# Patient Record
Sex: Male | Born: 1935 | Race: White | Hispanic: No | Marital: Married | Smoking: Never smoker
Health system: Southern US, Community
[De-identification: ages and names within clinical notes are randomized; demographics above are authoritative.]

## PROBLEM LIST (undated history)

## (undated) DIAGNOSIS — R001 Bradycardia, unspecified: Secondary | ICD-10-CM

## (undated) DIAGNOSIS — Z5189 Encounter for other specified aftercare: Secondary | ICD-10-CM

## (undated) DIAGNOSIS — R9389 Abnormal findings on diagnostic imaging of other specified body structures: Secondary | ICD-10-CM

## (undated) DIAGNOSIS — I4891 Unspecified atrial fibrillation: Secondary | ICD-10-CM

## (undated) DIAGNOSIS — I350 Nonrheumatic aortic (valve) stenosis: Secondary | ICD-10-CM

## (undated) DIAGNOSIS — I712 Thoracic aortic aneurysm, without rupture, unspecified: Secondary | ICD-10-CM

## (undated) DIAGNOSIS — I351 Nonrheumatic aortic (valve) insufficiency: Secondary | ICD-10-CM

## (undated) DIAGNOSIS — I10 Essential (primary) hypertension: Secondary | ICD-10-CM

## (undated) DIAGNOSIS — I5022 Chronic systolic (congestive) heart failure: Secondary | ICD-10-CM

## (undated) DIAGNOSIS — I509 Heart failure, unspecified: Secondary | ICD-10-CM

## (undated) DIAGNOSIS — Z7901 Long term (current) use of anticoagulants: Secondary | ICD-10-CM

## (undated) DIAGNOSIS — M1990 Unspecified osteoarthritis, unspecified site: Secondary | ICD-10-CM

## (undated) DIAGNOSIS — D62 Acute posthemorrhagic anemia: Secondary | ICD-10-CM

## (undated) DIAGNOSIS — Z9289 Personal history of other medical treatment: Secondary | ICD-10-CM

## (undated) DIAGNOSIS — I5021 Acute systolic (congestive) heart failure: Secondary | ICD-10-CM

## (undated) DIAGNOSIS — C801 Malignant (primary) neoplasm, unspecified: Secondary | ICD-10-CM

## (undated) DIAGNOSIS — I499 Cardiac arrhythmia, unspecified: Secondary | ICD-10-CM

## (undated) DIAGNOSIS — I2510 Atherosclerotic heart disease of native coronary artery without angina pectoris: Secondary | ICD-10-CM

## (undated) HISTORY — DX: Abnormal findings on diagnostic imaging of other specified body structures: R93.89

## (undated) HISTORY — DX: Thoracic aortic aneurysm, without rupture: I71.2

## (undated) HISTORY — DX: Atherosclerotic heart disease of native coronary artery without angina pectoris: I25.10

## (undated) HISTORY — DX: Acute systolic (congestive) heart failure: I50.21

## (undated) HISTORY — DX: Personal history of other medical treatment: Z92.89

## (undated) HISTORY — DX: Thoracic aortic aneurysm, without rupture, unspecified: I71.20

## (undated) HISTORY — DX: Nonrheumatic aortic (valve) insufficiency: I35.1

## (undated) HISTORY — DX: Acute posthemorrhagic anemia: D62

## (undated) HISTORY — DX: Long term (current) use of anticoagulants: Z79.01

---

## 1998-04-15 ENCOUNTER — Other Ambulatory Visit: Admission: RE | Admit: 1998-04-15 | Discharge: 1998-04-15 | Payer: Self-pay | Admitting: Hematology & Oncology

## 1999-02-17 ENCOUNTER — Encounter: Payer: Self-pay | Admitting: Interventional Cardiology

## 1999-02-17 ENCOUNTER — Inpatient Hospital Stay (HOSPITAL_COMMUNITY): Admission: EM | Admit: 1999-02-17 | Discharge: 1999-02-21 | Payer: Self-pay | Admitting: *Deleted

## 1999-02-20 HISTORY — PX: CARDIAC CATHETERIZATION: SHX172

## 1999-04-20 ENCOUNTER — Inpatient Hospital Stay (HOSPITAL_COMMUNITY): Admission: EM | Admit: 1999-04-20 | Discharge: 1999-04-24 | Payer: Self-pay | Admitting: Emergency Medicine

## 1999-04-20 ENCOUNTER — Encounter: Payer: Self-pay | Admitting: Cardiovascular Disease

## 2002-03-31 ENCOUNTER — Ambulatory Visit (HOSPITAL_COMMUNITY): Admission: RE | Admit: 2002-03-31 | Discharge: 2002-03-31 | Payer: Self-pay | Admitting: Orthopedic Surgery

## 2002-03-31 ENCOUNTER — Encounter: Payer: Self-pay | Admitting: Orthopedic Surgery

## 2002-06-15 ENCOUNTER — Ambulatory Visit: Admission: RE | Admit: 2002-06-15 | Discharge: 2002-09-05 | Payer: Self-pay | Admitting: Radiation Oncology

## 2002-10-13 ENCOUNTER — Ambulatory Visit: Admission: RE | Admit: 2002-10-13 | Discharge: 2002-10-13 | Payer: Self-pay | Admitting: Radiation Oncology

## 2002-11-16 ENCOUNTER — Encounter: Payer: Self-pay | Admitting: Hematology & Oncology

## 2002-11-16 ENCOUNTER — Ambulatory Visit (HOSPITAL_COMMUNITY): Admission: RE | Admit: 2002-11-16 | Discharge: 2002-11-16 | Payer: Self-pay | Admitting: Hematology & Oncology

## 2005-01-03 ENCOUNTER — Ambulatory Visit: Payer: Self-pay | Admitting: Hematology & Oncology

## 2005-07-04 ENCOUNTER — Ambulatory Visit: Payer: Self-pay | Admitting: Hematology & Oncology

## 2006-01-02 ENCOUNTER — Ambulatory Visit: Payer: Self-pay | Admitting: Hematology & Oncology

## 2006-07-02 ENCOUNTER — Ambulatory Visit: Payer: Self-pay | Admitting: Hematology & Oncology

## 2006-07-04 LAB — CBC WITH DIFFERENTIAL/PLATELET
BASO%: 0.3 % (ref 0.0–2.0)
Eosinophils Absolute: 0 10*3/uL (ref 0.0–0.5)
LYMPH%: 21.6 % (ref 14.0–48.0)
MCHC: 34.5 g/dL (ref 32.0–35.9)
MONO#: 0.4 10*3/uL (ref 0.1–0.9)
NEUT#: 3.4 10*3/uL (ref 1.5–6.5)
Platelets: 220 10*3/uL (ref 145–400)
RBC: 4.63 10*6/uL (ref 4.20–5.71)
RDW: 13 % (ref 11.2–14.6)
WBC: 5 10*3/uL (ref 4.0–10.0)
lymph#: 1.1 10*3/uL (ref 0.9–3.3)

## 2006-07-04 LAB — PSA: PSA: 0.01 ng/mL — ABNORMAL LOW (ref 0.10–4.00)

## 2006-07-04 LAB — CHCC SMEAR

## 2006-12-31 ENCOUNTER — Ambulatory Visit: Payer: Self-pay | Admitting: Hematology & Oncology

## 2007-01-02 LAB — COMPREHENSIVE METABOLIC PANEL
ALT: 13 U/L (ref 0–53)
Albumin: 4.1 g/dL (ref 3.5–5.2)
Alkaline Phosphatase: 71 U/L (ref 39–117)
CO2: 26 mEq/L (ref 19–32)
Glucose, Bld: 165 mg/dL — ABNORMAL HIGH (ref 70–99)
Potassium: 4.9 mEq/L (ref 3.5–5.3)
Sodium: 138 mEq/L (ref 135–145)
Total Bilirubin: 0.8 mg/dL (ref 0.3–1.2)
Total Protein: 6.9 g/dL (ref 6.0–8.3)

## 2007-01-02 LAB — CBC WITH DIFFERENTIAL/PLATELET
Basophils Absolute: 0 10*3/uL (ref 0.0–0.1)
Eosinophils Absolute: 0 10*3/uL (ref 0.0–0.5)
HGB: 14.9 g/dL (ref 13.0–17.1)
MONO#: 0.4 10*3/uL (ref 0.1–0.9)
MONO%: 8.9 % (ref 0.0–13.0)
NEUT#: 3.6 10*3/uL (ref 1.5–6.5)
RBC: 4.63 10*6/uL (ref 4.20–5.71)
RDW: 13.7 % (ref 11.2–14.6)
WBC: 4.6 10*3/uL (ref 4.0–10.0)
lymph#: 0.6 10*3/uL — ABNORMAL LOW (ref 0.9–3.3)

## 2007-01-02 LAB — CHCC SMEAR

## 2007-01-02 LAB — PSA: PSA: <0.01 ng/mL — ABNORMAL LOW (ref 0.10–4.00)

## 2007-07-01 ENCOUNTER — Ambulatory Visit: Payer: Self-pay | Admitting: Hematology & Oncology

## 2008-12-29 ENCOUNTER — Encounter: Admission: RE | Admit: 2008-12-29 | Discharge: 2008-12-29 | Payer: Self-pay | Admitting: Cardiovascular Disease

## 2009-05-26 ENCOUNTER — Inpatient Hospital Stay (HOSPITAL_COMMUNITY): Admission: EM | Admit: 2009-05-26 | Discharge: 2009-05-30 | Payer: Self-pay | Admitting: Emergency Medicine

## 2009-06-06 DIAGNOSIS — Z9289 Personal history of other medical treatment: Secondary | ICD-10-CM

## 2009-06-06 HISTORY — DX: Personal history of other medical treatment: Z92.89

## 2009-09-12 ENCOUNTER — Encounter: Admission: RE | Admit: 2009-09-12 | Discharge: 2009-09-12 | Payer: Self-pay | Admitting: Family Medicine

## 2010-01-30 DIAGNOSIS — R9389 Abnormal findings on diagnostic imaging of other specified body structures: Secondary | ICD-10-CM

## 2010-01-30 HISTORY — DX: Abnormal findings on diagnostic imaging of other specified body structures: R93.89

## 2010-06-02 ENCOUNTER — Encounter: Admission: RE | Admit: 2010-06-02 | Discharge: 2010-06-02 | Payer: Self-pay | Admitting: Family Medicine

## 2010-06-08 ENCOUNTER — Encounter: Admission: RE | Admit: 2010-06-08 | Discharge: 2010-06-08 | Payer: Self-pay | Admitting: Family Medicine

## 2010-07-07 ENCOUNTER — Ambulatory Visit (HOSPITAL_COMMUNITY): Admission: RE | Admit: 2010-07-07 | Discharge: 2010-07-07 | Payer: Self-pay | Admitting: Neurosurgery

## 2010-10-29 ENCOUNTER — Encounter: Payer: Self-pay | Admitting: Family Medicine

## 2010-11-05 IMAGING — CT CT ANGIO PELVIS
2 of 6 series · 15 of 46 positions shown, 17 images · IV contrast (APPLIED)
Comparison: CT chest of 12/29/2008

CTA CHEST

CLINICAL DATA: Chest pain, shortness of breath, possible
dissection, hypertension

CT ANGIOGRAPHY CHEST, ABDOMEN AND PELVIS
TECHNIQUE: Multidetector CT imaging through the chest, abdomen and
pelvis was performed using the standard protocol during bolus
administration of intravenous contrast. Multiplanar reconstructed
images including MIPs were obtained and reviewed to evaluate the
vascular anatomy.
Contrast: 120 ml [id number]-ZNN D

[Series 6: dissection 2.0 st · axial · 0.77mm/px · z∈[+752,+1334]mm · 12 of 332 slices shown, 14 images]
[im 27/332  soft-tissue]
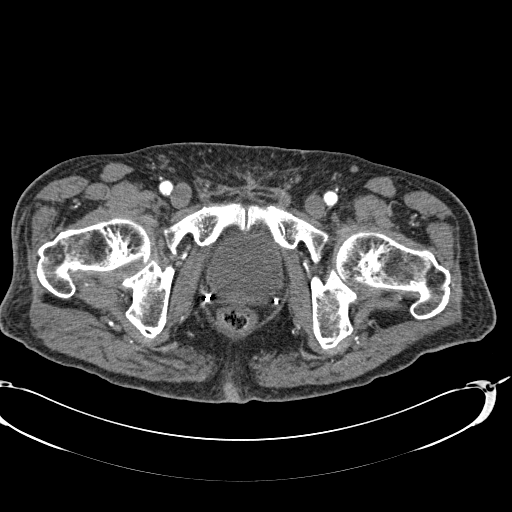
[im 27/332  bone]
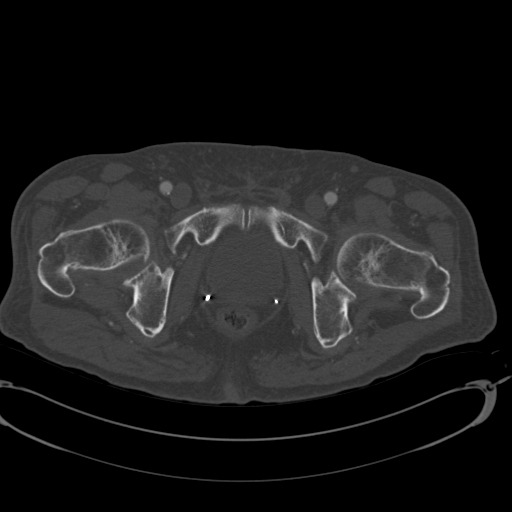
[im 53/332  soft-tissue]
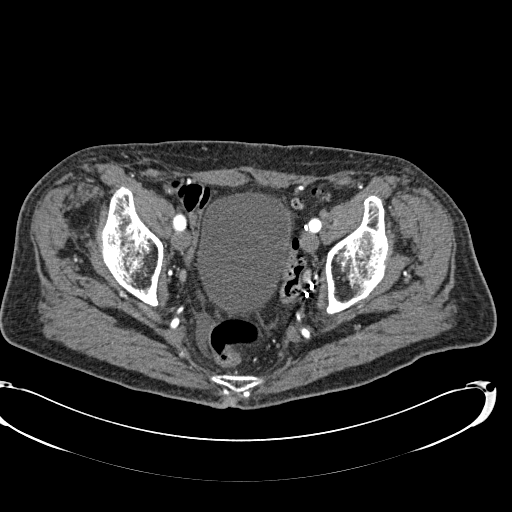
[im 80/332  soft-tissue]
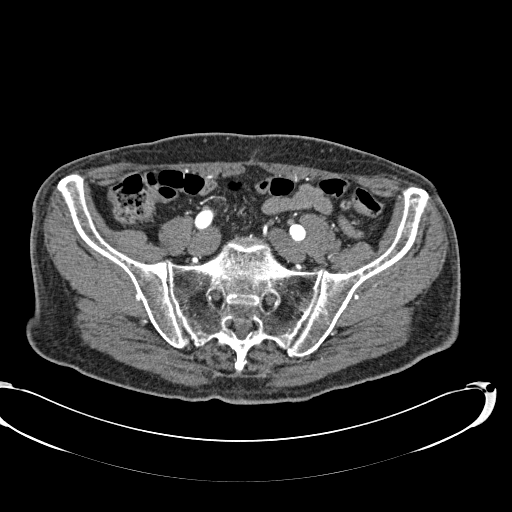
[im 106/332  soft-tissue]
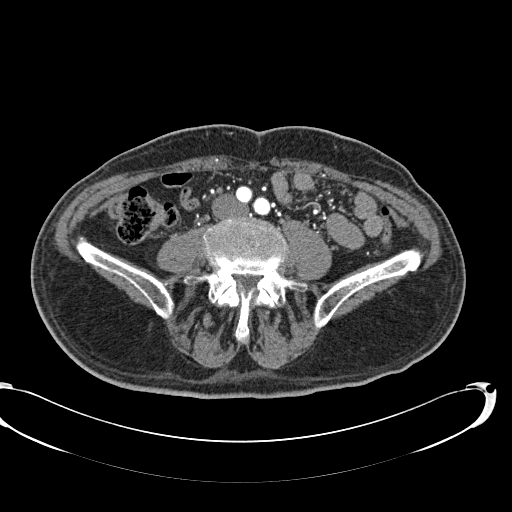
[im 133/332  soft-tissue]
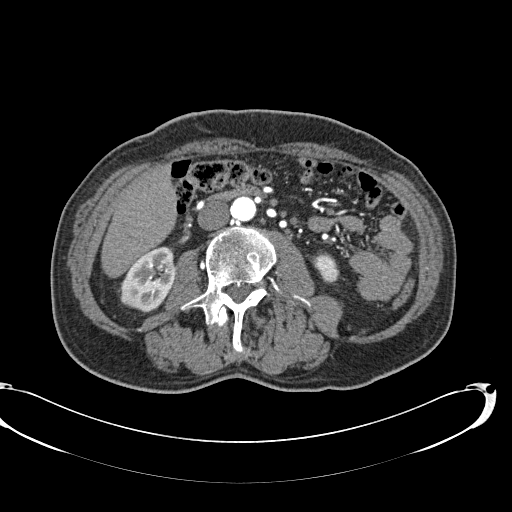
[im 159/332  soft-tissue]
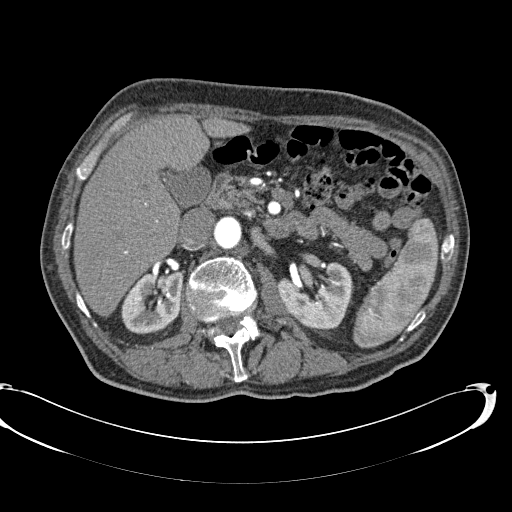
[im 186/332  soft-tissue]
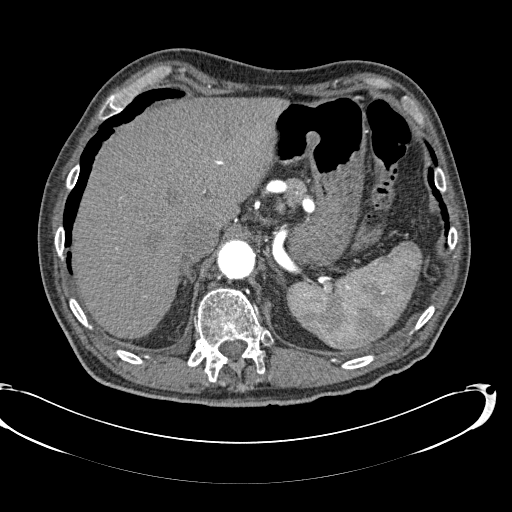
[im 212/332  soft-tissue]
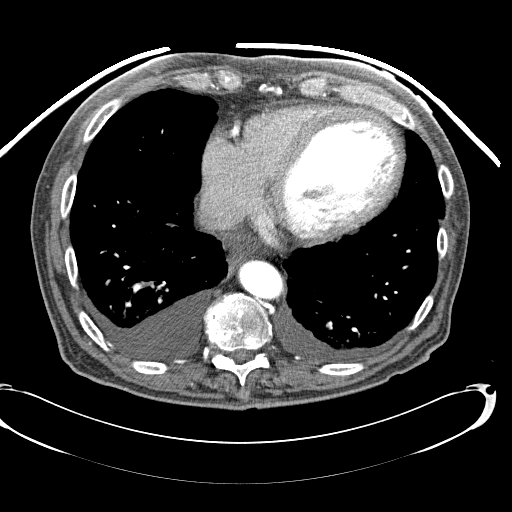
[im 239/332  soft-tissue]
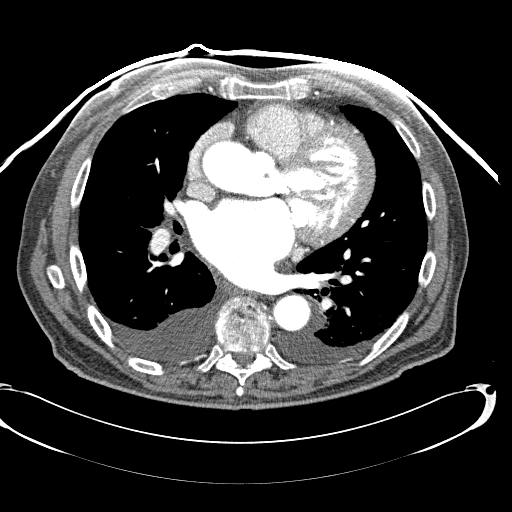
[im 239/332  bone]
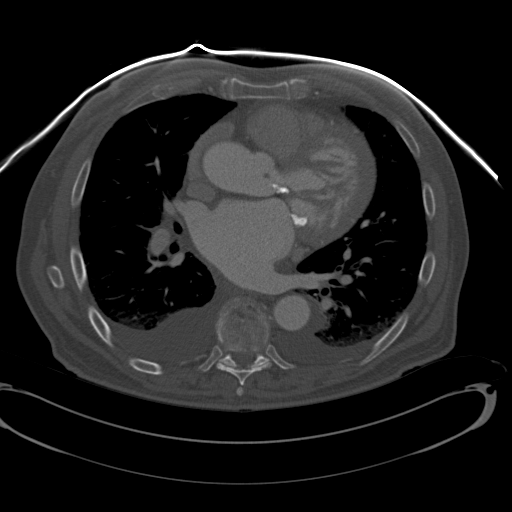
[im 265/332  soft-tissue]
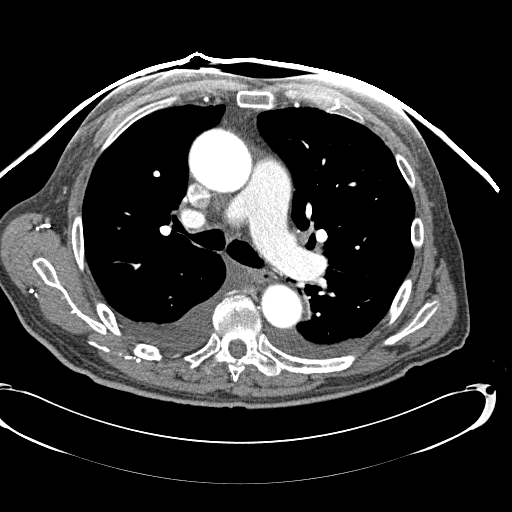
[im 292/332  soft-tissue]
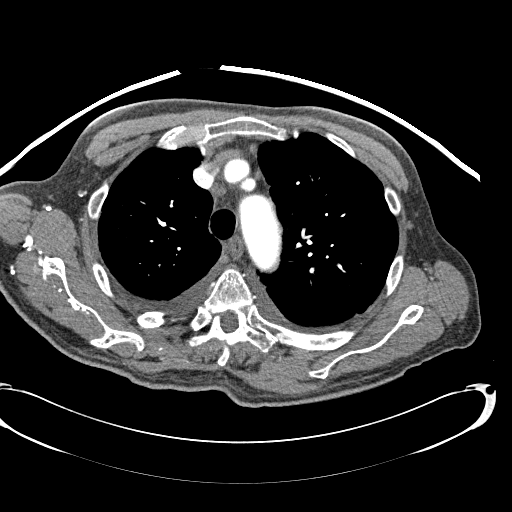
[im 318/332  soft-tissue]
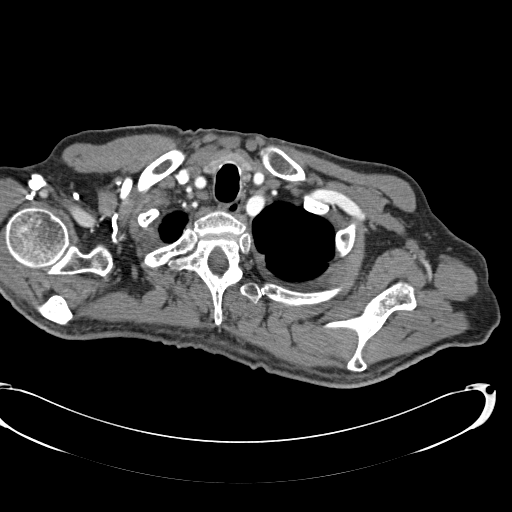

[Series 602: coronals · coronal · 1.30mm/px · 3 of 118 slices shown]
[im 30/118  soft-tissue]
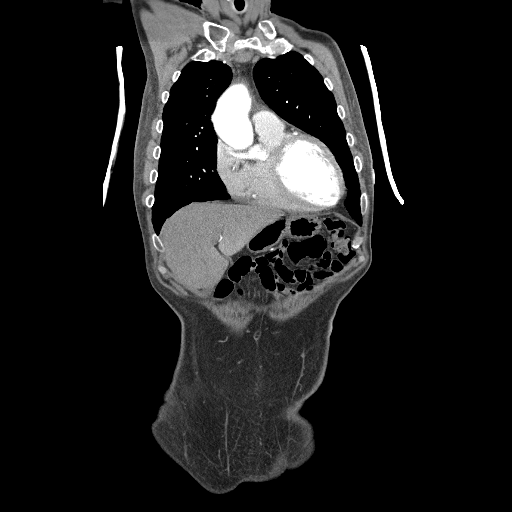
[im 59/118  soft-tissue]
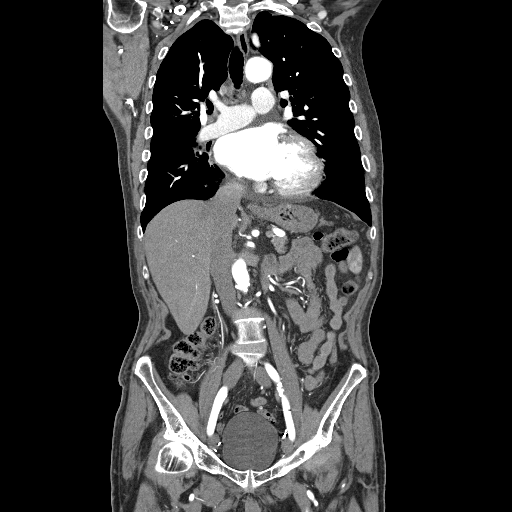
[im 88/118  soft-tissue]
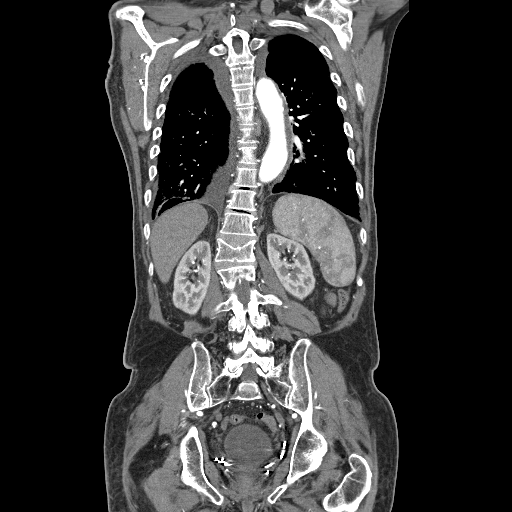

[15 of 46 positions shown; findings below may reference images not displayed]

FINDINGS: The thoracic aorta opacifies and there is no evidence of
acute dissection.  As noted on the prior dictation there is
fusiform dilatation of the ascending aorta, which is stable
measuring 50 x 50 mm in maximum diameter.  The pulmonary arteries
opacify with no acute pulmonary embolism noted.  No mediastinal or
hilar adenopathy is seen, with small mediastinal nodes appearing
stable.  The origins of the great vessels appear patent.

On the lung window images there are bilateral pleural effusions
present.  There is bibasilar atelectasis present and mild
interstitial edema is a consideration.  No lung nodule or
infiltrate is seen.

 Review of the MIP images confirms the above findings.
IMPRESSION: 1.  No evidence of acute aortic dissection.
2.  No pulmonary embolism is seen.
3.  Small pleural effusions with basilar atelectasis.  Cannot
exclude mild interstitial edema.

CTA ABDOMEN
FINDINGS: A focus of enhancement in the dome of the right lobe of
liver most like represents a small hypervascular hemangioma.
Otherwise the liver enhances with no focal abnormality.  No
calcified gallstones are seen.  The pancreas is normal in size and
the pancreatic duct is not dilated.  The adrenal glands and spleen
appear normal.  The kidneys enhance with no evidence of mass or
hydronephrosis.  Atheromatous changes noted in the abdominal aorta
but no focal aneurysm is seen and no dissection is noted.  There
appears to be a separate origin of the right hepatic to the right
of  the celiac axis.  The SMA is widely patent.  The renal arteries
are patent, as is the IMA.  There appear to be three right renal
arteries and two left renal arteries present.  No adenopathy is
seen.

 Review of the MIP images confirms the above findings.
IMPRESSION: 1.  No evidence of abdominal aortic aneurysm.
2.   Probable small hemangioma in the dome of the right lobe of
liver.
3.  Three right renal arteries and two left renal arteries are
noted.

CTA PELVIS
FINDINGS: The common iliac arteries are patent with no evidence of
stenosis.  Mild atheromatous change is noted in the distal left
common iliac artery.  The external iliac arteries and internal
iliac arteries are patent.  The urinary bladder is unremarkable.
Surgical clips are present apparently due to prior prostatectomy.
There is a tiny amount of fluid in the posterior right pelvis.
Rectosigmoid colonic diverticula are noted.  The terminal ileum
appears normal.  No acute bony abnormality is seen.

 Review of the MIP images confirms the above findings. Probable
small hemangioma in the dome of the right lobe of liver.
IMPRESSION: 1.  No vascular abnormality.
2.  Multiple rectosigmoid colonic diverticula. Small amount of free
fluid in pelvis of questionable significance.

## 2011-01-13 LAB — POCT I-STAT, CHEM 8
BUN: 25 mg/dL — ABNORMAL HIGH (ref 6–23)
Calcium, Ion: 1.08 mmol/L — ABNORMAL LOW (ref 1.12–1.32)
Creatinine, Ser: 1.2 mg/dL (ref 0.4–1.5)
Glucose, Bld: 102 mg/dL — ABNORMAL HIGH (ref 70–99)
Sodium: 139 mEq/L (ref 135–145)
TCO2: 23 mmol/L (ref 0–100)

## 2011-01-13 LAB — APTT: aPTT: 31 seconds (ref 24–37)

## 2011-01-13 LAB — CBC
HCT: 35.8 % — ABNORMAL LOW (ref 39.0–52.0)
HCT: 40.3 % (ref 39.0–52.0)
Hemoglobin: 12.4 g/dL — ABNORMAL LOW (ref 13.0–17.0)
Hemoglobin: 13.3 g/dL (ref 13.0–17.0)
Hemoglobin: 13.6 g/dL (ref 13.0–17.0)
MCHC: 33.7 g/dL (ref 30.0–36.0)
MCV: 96.7 fL (ref 78.0–100.0)
MCV: 97 fL (ref 78.0–100.0)
RBC: 3.7 MIL/uL — ABNORMAL LOW (ref 4.22–5.81)
RBC: 4 MIL/uL — ABNORMAL LOW (ref 4.22–5.81)
RBC: 4.15 MIL/uL — ABNORMAL LOW (ref 4.22–5.81)
RDW: 14.6 % (ref 11.5–15.5)
WBC: 5.3 10*3/uL (ref 4.0–10.5)
WBC: 6 10*3/uL (ref 4.0–10.5)

## 2011-01-13 LAB — CARDIAC PANEL(CRET KIN+CKTOT+MB+TROPI)
CK, MB: 2.2 ng/mL (ref 0.3–4.0)
CK, MB: 6.8 ng/mL — ABNORMAL HIGH (ref 0.3–4.0)
Total CK: 42 U/L (ref 7–232)
Total CK: 91 U/L (ref 7–232)

## 2011-01-13 LAB — BASIC METABOLIC PANEL
CO2: 29 mEq/L (ref 19–32)
CO2: 30 mEq/L (ref 19–32)
Calcium: 8.7 mg/dL (ref 8.4–10.5)
Calcium: 8.8 mg/dL (ref 8.4–10.5)
Calcium: 9 mg/dL (ref 8.4–10.5)
Chloride: 100 mEq/L (ref 96–112)
Chloride: 101 mEq/L (ref 96–112)
Creatinine, Ser: 1.25 mg/dL (ref 0.4–1.5)
GFR calc Af Amer: 53 mL/min — ABNORMAL LOW (ref >60)
GFR calc Af Amer: 59 mL/min — ABNORMAL LOW (ref >60)
GFR calc Af Amer: >60 mL/min (ref >60)
GFR calc non Af Amer: 49 mL/min — ABNORMAL LOW (ref >60)
GFR calc non Af Amer: 56 mL/min — ABNORMAL LOW (ref >60)
Glucose, Bld: 85 mg/dL (ref 70–99)
Potassium: 4.1 mEq/L (ref 3.5–5.1)
Sodium: 138 mEq/L (ref 135–145)
Sodium: 139 mEq/L (ref 135–145)
Sodium: 141 mEq/L (ref 135–145)

## 2011-01-13 LAB — HEPATIC FUNCTION PANEL
ALT: 21 U/L (ref 0–53)
AST: 25 U/L (ref 0–37)
Albumin: 4 g/dL (ref 3.5–5.2)
Total Bilirubin: 1.5 mg/dL — ABNORMAL HIGH (ref 0.3–1.2)

## 2011-01-13 LAB — URINALYSIS, ROUTINE W REFLEX MICROSCOPIC
Glucose, UA: NEGATIVE mg/dL
Hgb urine dipstick: NEGATIVE
Ketones, ur: 15 mg/dL — AB
Protein, ur: 30 mg/dL — AB

## 2011-01-13 LAB — URINE MICROSCOPIC-ADD ON

## 2011-01-13 LAB — DIFFERENTIAL
Eosinophils Absolute: 0 10*3/uL (ref 0.0–0.7)
Eosinophils Relative: 0 % (ref 0–5)
Lymphs Abs: 1.1 10*3/uL (ref 0.7–4.0)
Monocytes Absolute: 0.6 10*3/uL (ref 0.1–1.0)
Monocytes Relative: 10 % (ref 3–12)

## 2011-01-13 LAB — BRAIN NATRIURETIC PEPTIDE
Pro B Natriuretic peptide (BNP): 455 pg/mL — ABNORMAL HIGH (ref 0.0–100.0)
Pro B Natriuretic peptide (BNP): 668 pg/mL — ABNORMAL HIGH (ref 0.0–100.0)
Pro B Natriuretic peptide (BNP): 708 pg/mL — ABNORMAL HIGH (ref 0.0–100.0)

## 2011-01-13 LAB — HEMOGLOBIN A1C: Mean Plasma Glucose: 111 mg/dL

## 2011-01-13 LAB — LIPID PANEL
Cholesterol: 131 mg/dL (ref 0–200)
LDL Cholesterol: 97 mg/dL (ref 0–99)
Triglycerides: 36 mg/dL (ref <150)

## 2011-01-13 LAB — POCT CARDIAC MARKERS
CKMB, poc: 1.1 ng/mL (ref 1.0–8.0)
Myoglobin, poc: 93 ng/mL (ref 12–200)

## 2011-01-13 LAB — MAGNESIUM: Magnesium: 2.3 mg/dL (ref 1.5–2.5)

## 2011-01-13 LAB — PROTIME-INR: INR: 1.1 (ref 0.00–1.49)

## 2011-01-13 LAB — HEPARIN LEVEL (UNFRACTIONATED): Heparin Unfractionated: 1.05 IU/mL — ABNORMAL HIGH (ref 0.30–0.70)

## 2011-01-13 LAB — CK TOTAL AND CKMB (NOT AT ARMC): Total CK: 62 U/L (ref 7–232)

## 2012-03-04 ENCOUNTER — Other Ambulatory Visit (HOSPITAL_COMMUNITY): Payer: Self-pay | Admitting: Orthopaedic Surgery

## 2012-03-05 ENCOUNTER — Encounter (HOSPITAL_COMMUNITY): Payer: Self-pay

## 2012-03-05 ENCOUNTER — Encounter (HOSPITAL_COMMUNITY)
Admission: RE | Admit: 2012-03-05 | Discharge: 2012-03-05 | Disposition: A | Discharge status: Home / Self Care | Payer: Medicare Other | Source: Ambulatory Visit | Attending: Orthopaedic Surgery | Admitting: Orthopaedic Surgery

## 2012-03-05 ENCOUNTER — Encounter (HOSPITAL_COMMUNITY): Payer: Self-pay | Admitting: Pharmacy Technician

## 2012-03-05 DIAGNOSIS — I499 Cardiac arrhythmia, unspecified: Secondary | ICD-10-CM

## 2012-03-05 DIAGNOSIS — M1990 Unspecified osteoarthritis, unspecified site: Secondary | ICD-10-CM

## 2012-03-05 DIAGNOSIS — C801 Malignant (primary) neoplasm, unspecified: Secondary | ICD-10-CM

## 2012-03-05 DIAGNOSIS — Z5189 Encounter for other specified aftercare: Secondary | ICD-10-CM

## 2012-03-05 HISTORY — DX: Encounter for other specified aftercare: Z51.89

## 2012-03-05 HISTORY — DX: Essential (primary) hypertension: I10

## 2012-03-05 HISTORY — PX: PROSTATE SURGERY: SHX751

## 2012-03-05 HISTORY — PX: APPENDECTOMY: SHX54

## 2012-03-05 HISTORY — PX: HERNIA REPAIR: SHX51

## 2012-03-05 HISTORY — DX: Unspecified osteoarthritis, unspecified site: M19.90

## 2012-03-05 HISTORY — DX: Malignant (primary) neoplasm, unspecified: C80.1

## 2012-03-05 HISTORY — DX: Cardiac arrhythmia, unspecified: I49.9

## 2012-03-05 HISTORY — PX: KNEE ARTHROSCOPY: SUR90

## 2012-03-05 LAB — BASIC METABOLIC PANEL
BUN: 26 mg/dL — ABNORMAL HIGH (ref 6–23)
CO2: 29 mEq/L (ref 19–32)
Chloride: 100 mEq/L (ref 96–112)
GFR calc non Af Amer: 50 mL/min — ABNORMAL LOW (ref >90)
Glucose, Bld: 97 mg/dL (ref 70–99)
Potassium: 4.7 mEq/L (ref 3.5–5.1)

## 2012-03-05 LAB — URINALYSIS, ROUTINE W REFLEX MICROSCOPIC
Bilirubin Urine: NEGATIVE
Glucose, UA: NEGATIVE mg/dL
Hgb urine dipstick: NEGATIVE
Ketones, ur: NEGATIVE mg/dL
Protein, ur: NEGATIVE mg/dL
pH: 5 (ref 5.0–8.0)

## 2012-03-05 LAB — CBC
HCT: 35.9 % — ABNORMAL LOW (ref 39.0–52.0)
Hemoglobin: 11.6 g/dL — ABNORMAL LOW (ref 13.0–17.0)
MCH: 30.2 pg (ref 26.0–34.0)
MCHC: 32.3 g/dL (ref 30.0–36.0)
MCV: 93.5 fL (ref 78.0–100.0)
RBC: 3.84 MIL/uL — ABNORMAL LOW (ref 4.22–5.81)

## 2012-03-05 NOTE — Pre-Procedure Instructions (Addendum)
03-05-12 Teach/ back method used periop instructions. WUJ(81'19), Echo report(4'10), Stress test (06-06-09),CXR(4-30 -13) reports with chart.  03-06-12 Pt will use Mupirocin Oint as directed.

## 2012-03-05 NOTE — Patient Instructions (Addendum)
[address]  03/05/2012   Your procedure is scheduled on: 5-31  -2013  Report to [facility] at   0745     AM.  Call this number if you have problems the morning of surgery: [phone]   Remember:   Do not eat food:After Midnight.    Take these medicines the morning of surgery with A SIP OF WATER: Amiodarone, Protonix, Tramadol   Do not wear jewelry, make-up or nail polish.  Do not wear lotions, powders, or perfumes. You may wear deodorant.  Do not shave 48 hours prior to surgery.(face and neck okay, no shaving of legs)  Do not bring valuables to the hospital.  Contacts, dentures or bridgework may not be worn into surgery.  Leave suitcase in the car. After surgery it may be brought to your room.  For patients admitted to the hospital, checkout time is 11:00 AM the day of discharge.   Patients discharged the day of surgery will not be allowed to drive home.  Name and phone number of your driver: daughter/son  Special Instructions: CHG Shower Use Special Wash: 1/2 bottle night before surgery and 1/2 bottle morning of surgery.(avoid face and genitals)   Please read over the following fact sheets that you were given: MRSA Information, Blood Transfusion fact sheet.

## 2012-03-07 ENCOUNTER — Ambulatory Visit (HOSPITAL_COMMUNITY): Payer: Medicare Other

## 2012-03-07 ENCOUNTER — Encounter (HOSPITAL_COMMUNITY)
Admission: RE | Disposition: A | Discharge status: Home Health | Payer: Self-pay | Source: Ambulatory Visit | Attending: Orthopaedic Surgery

## 2012-03-07 ENCOUNTER — Encounter (HOSPITAL_COMMUNITY): Payer: Self-pay | Admitting: Anesthesiology

## 2012-03-07 ENCOUNTER — Inpatient Hospital Stay (HOSPITAL_COMMUNITY)
Admission: RE | Admit: 2012-03-07 | Discharge: 2012-03-11 | DRG: 470 | Disposition: A | Discharge status: Home Health | Payer: Medicare Other | Source: Ambulatory Visit | Attending: Orthopaedic Surgery | Admitting: Orthopaedic Surgery

## 2012-03-07 ENCOUNTER — Ambulatory Visit (HOSPITAL_COMMUNITY): Payer: Medicare Other | Admitting: Anesthesiology

## 2012-03-07 ENCOUNTER — Encounter (HOSPITAL_COMMUNITY): Payer: Self-pay | Admitting: *Deleted

## 2012-03-07 DIAGNOSIS — M1610 Unilateral primary osteoarthritis, unspecified hip: Principal | ICD-10-CM | POA: Diagnosis present

## 2012-03-07 DIAGNOSIS — Z01812 Encounter for preprocedural laboratory examination: Secondary | ICD-10-CM

## 2012-03-07 DIAGNOSIS — D62 Acute posthemorrhagic anemia: Secondary | ICD-10-CM | POA: Diagnosis not present

## 2012-03-07 DIAGNOSIS — M169 Osteoarthritis of hip, unspecified: Principal | ICD-10-CM | POA: Diagnosis present

## 2012-03-07 DIAGNOSIS — I10 Essential (primary) hypertension: Secondary | ICD-10-CM | POA: Diagnosis present

## 2012-03-07 HISTORY — PX: TOTAL HIP ARTHROPLASTY: SHX124

## 2012-03-07 SURGERY — ARTHROPLASTY, HIP, TOTAL, ANTERIOR APPROACH
Anesthesia: Spinal | Site: Hip | Laterality: Right | Wound class: Clean

## 2012-03-07 MED ORDER — ACETAMINOPHEN 10 MG/ML IV SOLN
INTRAVENOUS | Status: DC | PRN
  Administered 2012-03-07: 1000 mg via INTRAVENOUS

## 2012-03-07 MED ORDER — RIVAROXABAN 10 MG PO TABS
10.0000 mg | ORAL_TABLET | Freq: Every day | ORAL | Status: DC
  Administered 2012-03-08 – 2012-03-11 (×4): 10 mg via ORAL
  Filled 2012-03-07 (×5): qty 1

## 2012-03-07 MED ORDER — TRAMADOL HCL 50 MG PO TABS
50.0000 mg | ORAL_TABLET | Freq: Four times a day (QID) | ORAL | Status: DC
  Administered 2012-03-07 – 2012-03-10 (×10): 50 mg via ORAL
  Filled 2012-03-07 (×19): qty 1

## 2012-03-07 MED ORDER — ACETAMINOPHEN 325 MG PO TABS
650.0000 mg | ORAL_TABLET | Freq: Four times a day (QID) | ORAL | Status: DC | PRN
  Administered 2012-03-10: 650 mg via ORAL
  Filled 2012-03-07 (×2): qty 2

## 2012-03-07 MED ORDER — FUROSEMIDE 40 MG PO TABS
40.0000 mg | ORAL_TABLET | Freq: Every day | ORAL | Status: DC
  Administered 2012-03-08 – 2012-03-10 (×3): 40 mg via ORAL
  Filled 2012-03-07 (×5): qty 1

## 2012-03-07 MED ORDER — MORPHINE SULFATE 2 MG/ML IJ SOLN
1.0000 mg | INTRAMUSCULAR | Status: DC | PRN
  Administered 2012-03-07 – 2012-03-08 (×5): 1 mg via INTRAVENOUS
  Filled 2012-03-07 (×5): qty 1

## 2012-03-07 MED ORDER — LACTATED RINGERS IV SOLN
INTRAVENOUS | Status: DC | PRN
  Administered 2012-03-07 (×2): via INTRAVENOUS

## 2012-03-07 MED ORDER — MENTHOL 3 MG MT LOZG
1.0000 | LOZENGE | OROMUCOSAL | Status: DC | PRN
  Filled 2012-03-07: qty 9

## 2012-03-07 MED ORDER — ACETAMINOPHEN 10 MG/ML IV SOLN
INTRAVENOUS | Status: AC
  Filled 2012-03-07: qty 100

## 2012-03-07 MED ORDER — 0.9 % SODIUM CHLORIDE (POUR BTL) OPTIME
TOPICAL | Status: DC | PRN
  Administered 2012-03-07: 1000 mL

## 2012-03-07 MED ORDER — ONDANSETRON HCL 4 MG PO TABS
4.0000 mg | ORAL_TABLET | Freq: Four times a day (QID) | ORAL | Status: DC | PRN
  Administered 2012-03-10: 4 mg via ORAL
  Filled 2012-03-07 (×2): qty 1

## 2012-03-07 MED ORDER — PANTOPRAZOLE SODIUM 40 MG PO TBEC
40.0000 mg | DELAYED_RELEASE_TABLET | Freq: Every day | ORAL | Status: DC
  Administered 2012-03-08 – 2012-03-10 (×3): 40 mg via ORAL
  Filled 2012-03-07 (×5): qty 1

## 2012-03-07 MED ORDER — DIPHENHYDRAMINE HCL 12.5 MG/5ML PO ELIX
12.5000 mg | ORAL_SOLUTION | ORAL | Status: DC | PRN
  Administered 2012-03-10: 12.5 mg via ORAL
  Filled 2012-03-07: qty 5

## 2012-03-07 MED ORDER — LACTATED RINGERS IV SOLN: INTRAVENOUS | Status: DC

## 2012-03-07 MED ORDER — CEFAZOLIN SODIUM 1-5 GM-% IV SOLN
INTRAVENOUS | Status: AC
Start: 2012-03-07 — End: 2012-03-07
  Filled 2012-03-07: qty 100

## 2012-03-07 MED ORDER — ZOLPIDEM TARTRATE 5 MG PO TABS: 5.0000 mg | ORAL_TABLET | Freq: Every evening | ORAL | Status: DC | PRN

## 2012-03-07 MED ORDER — METHOCARBAMOL 500 MG PO TABS
500.0000 mg | ORAL_TABLET | Freq: Four times a day (QID) | ORAL | Status: DC | PRN
  Administered 2012-03-08 – 2012-03-11 (×4): 500 mg via ORAL
  Filled 2012-03-07 (×5): qty 1

## 2012-03-07 MED ORDER — BUPIVACAINE HCL (PF) 0.5 % IJ SOLN
INTRAMUSCULAR | Status: DC | PRN
  Administered 2012-03-07: 3 mL

## 2012-03-07 MED ORDER — METOCLOPRAMIDE HCL 10 MG PO TABS
5.0000 mg | ORAL_TABLET | Freq: Three times a day (TID) | ORAL | Status: DC | PRN
  Administered 2012-03-08 – 2012-03-09 (×2): 10 mg via ORAL
  Filled 2012-03-07 (×2): qty 1

## 2012-03-07 MED ORDER — FERROUS SULFATE 325 (65 FE) MG PO TABS
325.0000 mg | ORAL_TABLET | Freq: Three times a day (TID) | ORAL | Status: DC
  Administered 2012-03-07 – 2012-03-10 (×7): 325 mg via ORAL
  Filled 2012-03-07 (×15): qty 1

## 2012-03-07 MED ORDER — METOCLOPRAMIDE HCL 5 MG/ML IJ SOLN
5.0000 mg | Freq: Three times a day (TID) | INTRAMUSCULAR | Status: DC | PRN
  Administered 2012-03-07: 10 mg via INTRAVENOUS
  Filled 2012-03-07: qty 2

## 2012-03-07 MED ORDER — ACETAMINOPHEN 650 MG RE SUPP: 650.0000 mg | Freq: Four times a day (QID) | RECTAL | Status: DC | PRN

## 2012-03-07 MED ORDER — CEFAZOLIN SODIUM 1-5 GM-% IV SOLN
1.0000 g | Freq: Four times a day (QID) | INTRAVENOUS | Status: AC
  Administered 2012-03-07 – 2012-03-08 (×3): 1 g via INTRAVENOUS
  Filled 2012-03-07 (×3): qty 50

## 2012-03-07 MED ORDER — ENALAPRIL MALEATE 20 MG PO TABS
20.0000 mg | ORAL_TABLET | Freq: Every day | ORAL | Status: DC
  Administered 2012-03-08 – 2012-03-10 (×3): 20 mg via ORAL
  Filled 2012-03-07 (×5): qty 1

## 2012-03-07 MED ORDER — RIVAROXABAN 10 MG PO TABS: 20.0000 mg | ORAL_TABLET | Freq: Every day | ORAL | Status: DC

## 2012-03-07 MED ORDER — DOCUSATE SODIUM 100 MG PO CAPS
100.0000 mg | ORAL_CAPSULE | Freq: Two times a day (BID) | ORAL | Status: DC
  Administered 2012-03-07 – 2012-03-10 (×7): 100 mg via ORAL

## 2012-03-07 MED ORDER — OXYCODONE HCL 5 MG PO TABS
5.0000 mg | ORAL_TABLET | ORAL | Status: DC | PRN
Start: 2012-03-07 — End: 2012-03-11

## 2012-03-07 MED ORDER — ONDANSETRON HCL 4 MG/2ML IJ SOLN
4.0000 mg | Freq: Four times a day (QID) | INTRAMUSCULAR | Status: DC | PRN
  Administered 2012-03-07 – 2012-03-11 (×4): 4 mg via INTRAVENOUS
  Filled 2012-03-07 (×4): qty 2

## 2012-03-07 MED ORDER — PHENYLEPHRINE HCL 10 MG/ML IJ SOLN
INTRAMUSCULAR | Status: DC | PRN
  Administered 2012-03-07: 10 ug via INTRAVENOUS
  Administered 2012-03-07 (×3): 20 ug via INTRAVENOUS

## 2012-03-07 MED ORDER — PROPOFOL 10 MG/ML IV EMUL
INTRAVENOUS | Status: DC | PRN
  Administered 2012-03-07: 100 ug/kg/min via INTRAVENOUS

## 2012-03-07 MED ORDER — CEFAZOLIN SODIUM 1-5 GM-% IV SOLN
INTRAVENOUS | Status: DC | PRN
  Administered 2012-03-07: 2 g via INTRAVENOUS

## 2012-03-07 MED ORDER — MIDAZOLAM HCL 5 MG/5ML IJ SOLN
INTRAMUSCULAR | Status: DC | PRN
  Administered 2012-03-07 (×2): 1 mg via INTRAVENOUS

## 2012-03-07 MED ORDER — PROMETHAZINE HCL 25 MG/ML IJ SOLN: 6.2500 mg | INTRAMUSCULAR | Status: DC | PRN

## 2012-03-07 MED ORDER — SODIUM CHLORIDE 0.9 % IV SOLN
INTRAVENOUS | Status: DC
  Administered 2012-03-07 – 2012-03-09 (×3): via INTRAVENOUS
  Administered 2012-03-10: 500 mL/h via INTRAVENOUS

## 2012-03-07 MED ORDER — ALUM & MAG HYDROXIDE-SIMETH 200-200-20 MG/5ML PO SUSP: 30.0000 mL | ORAL | Status: DC | PRN

## 2012-03-07 MED ORDER — BUPIVACAINE HCL (PF) 0.5 % IJ SOLN
INTRAMUSCULAR | Status: AC
  Filled 2012-03-07: qty 30

## 2012-03-07 MED ORDER — HYDROCODONE-ACETAMINOPHEN 5-325 MG PO TABS
1.0000 | ORAL_TABLET | ORAL | Status: DC | PRN
  Administered 2012-03-08 – 2012-03-10 (×4): 2 via ORAL
  Administered 2012-03-10: 1 via ORAL
  Administered 2012-03-10 (×2): 2 via ORAL
  Administered 2012-03-11: 1 via ORAL
  Filled 2012-03-07 (×6): qty 2
  Filled 2012-03-07 (×2): qty 1

## 2012-03-07 MED ORDER — ADULT MULTIVITAMIN W/MINERALS CH
1.0000 | ORAL_TABLET | Freq: Every day | ORAL | Status: DC
  Administered 2012-03-08 – 2012-03-10 (×3): 1 via ORAL
  Filled 2012-03-07 (×5): qty 1

## 2012-03-07 MED ORDER — CEFAZOLIN SODIUM-DEXTROSE 2-3 GM-% IV SOLR: 2.0000 g | INTRAVENOUS | Status: DC

## 2012-03-07 MED ORDER — AMIODARONE HCL 100 MG PO TABS
100.0000 mg | ORAL_TABLET | Freq: Every day | ORAL | Status: DC
  Administered 2012-03-08 – 2012-03-10 (×3): 100 mg via ORAL
  Filled 2012-03-07 (×5): qty 1

## 2012-03-07 MED ORDER — MEPERIDINE HCL 50 MG/ML IJ SOLN: 6.2500 mg | INTRAMUSCULAR | Status: DC | PRN

## 2012-03-07 MED ORDER — PHENOL 1.4 % MT LIQD: 1.0000 | OROMUCOSAL | Status: DC | PRN

## 2012-03-07 MED ORDER — LACTATED RINGERS IV SOLN
INTRAVENOUS | Status: DC
  Administered 2012-03-07: 1000 mL via INTRAVENOUS

## 2012-03-07 MED ORDER — METHOCARBAMOL 100 MG/ML IJ SOLN
500.0000 mg | Freq: Four times a day (QID) | INTRAVENOUS | Status: DC | PRN
  Administered 2012-03-07 – 2012-03-08 (×3): 500 mg via INTRAVENOUS
  Filled 2012-03-07 (×3): qty 5

## 2012-03-07 MED ORDER — HYDROMORPHONE HCL PF 1 MG/ML IJ SOLN: 0.2500 mg | INTRAMUSCULAR | Status: DC | PRN

## 2012-03-07 SURGICAL SUPPLY — 37 items
BAG SPEC THK2 15X12 ZIP CLS (MISCELLANEOUS) ×2
BAG ZIPLOCK 12X15 (MISCELLANEOUS) ×4 IMPLANT
BLADE SAW SGTL 18X1.27X75 (BLADE) ×2 IMPLANT
CELLS DAT CNTRL 66122 CELL SVR (MISCELLANEOUS) ×1 IMPLANT
CLOTH BEACON ORANGE TIMEOUT ST (SAFETY) ×2 IMPLANT
DRAPE C-ARM 42X72 X-RAY (DRAPES) ×2 IMPLANT
DRAPE STERI IOBAN 125X83 (DRAPES) ×2 IMPLANT
DRAPE U-SHAPE 47X51 STRL (DRAPES) ×6 IMPLANT
DRSG MEPILEX BORDER 4X8 (GAUZE/BANDAGES/DRESSINGS) ×2 IMPLANT
DURAPREP 26ML APPLICATOR (WOUND CARE) ×2 IMPLANT
ELECT BLADE TIP CTD 4 INCH (ELECTRODE) ×2 IMPLANT
ELECT REM PT RETURN 9FT ADLT (ELECTROSURGICAL) ×2
ELECTRODE REM PT RTRN 9FT ADLT (ELECTROSURGICAL) ×1 IMPLANT
FACESHIELD LNG OPTICON STERILE (SAFETY) ×7 IMPLANT
GAUZE XEROFORM 1X8 LF (GAUZE/BANDAGES/DRESSINGS) ×2 IMPLANT
GLOVE BIO SURGEON STRL SZ7 (GLOVE) ×1 IMPLANT
GLOVE BIO SURGEON STRL SZ7.5 (GLOVE) ×3 IMPLANT
GLOVE BIOGEL PI IND STRL 7.5 (GLOVE) IMPLANT
GLOVE BIOGEL PI IND STRL 8 (GLOVE) ×1 IMPLANT
GLOVE BIOGEL PI INDICATOR 7.5 (GLOVE)
GLOVE BIOGEL PI INDICATOR 8 (GLOVE) ×1
GLOVE ECLIPSE 7.0 STRL STRAW (GLOVE) ×1 IMPLANT
GOWN STRL REIN XL XLG (GOWN DISPOSABLE) ×4 IMPLANT
KIT BASIN OR (CUSTOM PROCEDURE TRAY) ×2 IMPLANT
PACK TOTAL JOINT (CUSTOM PROCEDURE TRAY) ×2 IMPLANT
PADDING CAST COTTON 6X4 STRL (CAST SUPPLIES) ×2 IMPLANT
RETRACTOR WND ALEXIS 18 MED (MISCELLANEOUS) ×1 IMPLANT
RTRCTR WOUND ALEXIS 18CM MED (MISCELLANEOUS) ×2
STAPLER VISISTAT 35W (STAPLE) ×1 IMPLANT
SUT ETHIBOND NAB CT1 #1 30IN (SUTURE) ×3 IMPLANT
SUT VIC AB 1 CT1 36 (SUTURE) ×3 IMPLANT
SUT VIC AB 2-0 CT1 27 (SUTURE) ×2
SUT VIC AB 2-0 CT1 TAPERPNT 27 (SUTURE) ×1 IMPLANT
SUT VLOC 180 0 24IN GS25 (SUTURE) ×1 IMPLANT
TOWEL OR 17X26 10 PK STRL BLUE (TOWEL DISPOSABLE) ×4 IMPLANT
TOWEL OR NON WOVEN STRL DISP B (DISPOSABLE) ×2 IMPLANT
TRAY FOLEY CATH 14FRSI W/METER (CATHETERS) ×2 IMPLANT

## 2012-03-07 NOTE — Brief Op Note (Signed)
03/07/2012  11:53 AM  PATIENT:  Blake Stark  78 y.o. male  PRE-OPERATIVE DIAGNOSIS:  Severe arthritis right hip  POST-OPERATIVE DIAGNOSIS:  Severe arthritis right hip  PROCEDURE:  Procedure(s) (LRB): TOTAL HIP ARTHROPLASTY ANTERIOR APPROACH (Right)  SURGEON:  Surgeon(s) and Role:    * Kathryne Hitch, MD - Primary  PHYSICIAN ASSISTANT:   ASSISTANTS: none   ANESTHESIA:   spinal  EBL:  Total I/O In: 1200 [I.V.:1200] Out: 1150 [Urine:300; Blood:850]  BLOOD ADMINISTERED:none  DRAINS: none   LOCAL MEDICATIONS USED:  NONE  SPECIMEN:  No Specimen  DISPOSITION OF SPECIMEN:  N/A  COUNTS:  YES  TOURNIQUET:  * No tourniquets in log *  DICTATION: .Other Dictation: Dictation Number [id number]  PLAN OF CARE: Admit to inpatient   PATIENT DISPOSITION:  PACU - hemodynamically stable.   Delay start of Pharmacological VTE agent (>24hrs) due to surgical blood loss or risk of bleeding: no

## 2012-03-07 NOTE — H&P (Signed)
Blake Stark is an 78 y.o. male.   Chief Complaint:   Severe right hip pain HPI:   78 yo male with severe right hip pain and known end-stage OA.  There is collapse of the femoral head and no joint space on x-rays.  He wishes to proceed with a total hip replacement given his pain, poor mobility, and poor quality of life.  He understands the risks of blood loss, fracture, infection, DVT and PE.  The goals are improved function and decreased pain.  Past Medical History  Diagnosis Date  . Cancer 03-05-12    Prostate cancer  . Hypertension   . Dysrhythmia 03-05-12    occ. skip beat, hx. RBBB  . Arthritis 03-05-12    severe arthritis right hip  . Blood transfusion 03-05-12    tx. ulcer-many yrs ago.    Past Surgical History  Procedure Date  . Prostate surgery 03-05-12    surgery/ with radiation  later after PSA Increased  . Appendectomy 03-05-12    '62  . Hernia repair 03-05-12    RIH  . Knee arthroscopy 03-05-12    Bilateral    History reviewed. No pertinent family history. Social History:  reports that he has never smoked. He does not have any smokeless tobacco history on file. He reports that he does not drink alcohol or use illicit drugs.  Allergies:  Allergies  Allergen Reactions  . [place] (Trospium)     Reaction=heart failure per patient??    Medications Prior to Admission  Medication Sig Dispense Refill  . amiodarone (PACERONE) 200 MG tablet Take 100 mg by mouth daily with breakfast. Patient takes one half tablet per day      . enalapril (VASOTEC) 20 MG tablet Take 20 mg by mouth daily with breakfast.      . furosemide (LASIX) 40 MG tablet Take 40 mg by mouth daily with breakfast.      . Multiple Vitamin (MULITIVITAMIN WITH MINERALS) TABS Take 1 tablet by mouth daily with breakfast.      . naproxen sodium (ANAPROX) 550 MG tablet Take 550 mg by mouth 2 (two) times daily with a meal.      . pantoprazole (PROTONIX) 40 MG tablet Take 40 mg by mouth daily with breakfast.        . traMADol (ULTRAM) 50 MG tablet Take 50 mg by mouth 4 (four) times daily.      . fish oil-omega-3 fatty acids 1000 MG capsule Take 1 g by mouth daily with breakfast.        Results for orders placed during the hospital encounter of 03/05/12 (from the past 48 hour(s))  SURGICAL PCR SCREEN     Status: Abnormal   Collection Time   03/05/12  1:45 PM      Component Value Range Comment   MRSA, PCR POSITIVE (*) NEGATIVE     Staphylococcus aureus POSITIVE (*) NEGATIVE    URINALYSIS, ROUTINE W REFLEX MICROSCOPIC     Status: Normal   Collection Time   03/05/12  2:27 PM      Component Value Range Comment   Color, Urine YELLOW  YELLOW     APPearance CLEAR  CLEAR     Specific Gravity, Urine 1.017  1.005 - 1.030     pH 5.0  5.0 - 8.0     Glucose, UA NEGATIVE  NEGATIVE (mg/dL)    Hgb urine dipstick NEGATIVE  NEGATIVE     Bilirubin Urine NEGATIVE  NEGATIVE  Ketones, ur NEGATIVE  NEGATIVE (mg/dL)    Protein, ur NEGATIVE  NEGATIVE (mg/dL)    Urobilinogen, UA 0.2  0.0 - 1.0 (mg/dL)    Nitrite NEGATIVE  NEGATIVE     Leukocytes, UA NEGATIVE  NEGATIVE  MICROSCOPIC NOT DONE ON URINES WITH NEGATIVE PROTEIN, BLOOD, LEUKOCYTES, NITRITE, OR GLUCOSE <1000 mg/dL.  TYPE AND SCREEN     Status: Normal   Collection Time   03/05/12  2:50 PM      Component Value Range Comment   ABO/RH(D) O POS      Antibody Screen NEG      Sample Expiration 03/10/2012     BASIC METABOLIC PANEL     Status: Abnormal   Collection Time   03/05/12  2:50 PM      Component Value Range Comment   Sodium 137  135 - 145 (mEq/L)    Potassium 4.7  3.5 - 5.1 (mEq/L)    Chloride 100  96 - 112 (mEq/L)    CO2 29  19 - 32 (mEq/L)    Glucose, Bld 97  70 - 99 (mg/dL)    BUN 26 (*) 6 - 23 (mg/dL)    Creatinine, Ser 1.61  0.50 - 1.35 (mg/dL)    Calcium 9.2  8.4 - 10.5 (mg/dL)    GFR calc non Af Amer 50 (*) >90 (mL/min)    GFR calc Af Amer 58 (*) >90 (mL/min)   CBC     Status: Abnormal   Collection Time   03/05/12  2:50 PM      Component  Value Range Comment   WBC 5.9  4.0 - 10.5 (K/uL)    RBC 3.84 (*) 4.22 - 5.81 (MIL/uL)    Hemoglobin 11.6 (*) 13.0 - 17.0 (g/dL)    HCT 09.6 (*) 04.5 - 52.0 (%)    MCV 93.5  78.0 - 100.0 (fL)    MCH 30.2  26.0 - 34.0 (pg)    MCHC 32.3  30.0 - 36.0 (g/dL)    RDW 40.9  81.1 - 91.4 (%)    Platelets 275  150 - 400 (K/uL)   ABO/RH     Status: Normal   Collection Time   03/05/12  6:50 PM      Component Value Range Comment   ABO/RH(D) O POS      No results found.  Review of Systems  All other systems reviewed and are negative.    Blood pressure 134/67, pulse 67, temperature 96.8 F (36 C), resp. rate 20, SpO2 99.00%. Physical Exam  Constitutional: He is oriented to person, place, and time. He appears well-developed and well-nourished.  HENT:  Head: Normocephalic and atraumatic.  Eyes: EOM are normal. Pupils are equal, round, and reactive to light.  Neck: Normal range of motion. Neck supple.  Cardiovascular: Normal rate and regular rhythm.   Respiratory: Effort normal and breath sounds normal.  GI: Soft. Bowel sounds are normal.  Musculoskeletal:       Right hip: He exhibits decreased range of motion, decreased strength, bony tenderness and crepitus.  Neurological: He is alert and oriented to person, place, and time.  Skin: Skin is warm and dry.  Psychiatric: He has a normal mood and affect.     Assessment/Plan To the OR for a right total hip replacement.  Ronin Crager Y 03/07/2012, 9:24 AM

## 2012-03-07 NOTE — Anesthesia Postprocedure Evaluation (Signed)
  Anesthesia Post-op Note  Patient: Blake Stark  Procedure(s) Performed: Procedure(s) (LRB): TOTAL HIP ARTHROPLASTY ANTERIOR APPROACH (Right)  Patient Location: PACU  Anesthesia Type: Spinal  Level of Consciousness: awake and alert   Airway and Oxygen Therapy: Patient Spontanous Breathing  Post-op Pain: mild  Post-op Assessment: Post-op Vital signs reviewed, Patient's Cardiovascular Status Stable, Respiratory Function Stable, Patent Airway and No signs of Nausea or vomiting  Post-op Vital Signs: stable  Complications: No apparent anesthesia complications

## 2012-03-07 NOTE — Transfer of Care (Signed)
Immediate Anesthesia Transfer of Care Note  Patient: Blake Stark  Procedure(s) Performed: Procedure(s) (LRB): TOTAL HIP ARTHROPLASTY ANTERIOR APPROACH (Right)  Patient Location: PACU  Anesthesia Type: Regional  Level of Consciousness: awake, alert , oriented and patient cooperative  Airway & Oxygen Therapy: Patient Spontanous Breathing and Patient connected to face mask oxygen  Post-op Assessment: Report given to PACU RN and Post -op Vital signs reviewed and stable  Post vital signs: Reviewed and stable  Complications: No apparent anesthesia complications

## 2012-03-07 NOTE — Anesthesia Procedure Notes (Signed)
Spinal Patient location during procedure: OR Staffing Anesthesiologist: Ansh Fauble Performed by: anesthesiologist  Preanesthetic Checklist Completed: patient identified, site marked, surgical consent, pre-op evaluation, timeout performed, IV checked, risks and benefits discussed and monitors and equipment checked Spinal Block Patient position: sitting Prep: Betadine Patient monitoring: heart rate, continuous pulse ox and blood pressure Approach: left paramedian Location: L2-3 Injection technique: single-shot Needle Needle type: Spinocan  Needle gauge: 22 G Needle length: 9 cm Additional Notes Expiration date of kit checked and confirmed. Patient tolerated procedure well, without complications.     

## 2012-03-07 NOTE — Anesthesia Preprocedure Evaluation (Addendum)
Anesthesia Evaluation  Patient identified by MRN, date of birth, ID band Patient awake    Reviewed: Allergy & Precautions, H&P , NPO status , Patient's Chart, lab work & pertinent test results  Airway Mallampati: I TM Distance: >3 FB Neck ROM: Full    Dental No notable dental hx.    Pulmonary neg pulmonary ROS,  breath sounds clear to auscultation  Pulmonary exam normal       Cardiovascular hypertension, Pt. on medications negative cardio ROS  - dysrhythmias Rhythm:Regular Rate:Normal  RBBB   Neuro/Psych negative neurological ROS  negative psych ROS   GI/Hepatic negative GI ROS, Neg liver ROS,   Endo/Other  negative endocrine ROS  Renal/GU negative Renal ROS  negative genitourinary   Musculoskeletal negative musculoskeletal ROS (+)   Abdominal   Peds negative pediatric ROS (+)  Hematology negative hematology ROS (+)   Anesthesia Other Findings   Reproductive/Obstetrics negative OB ROS                          Anesthesia Physical Anesthesia Plan  ASA: II  Anesthesia Plan: Spinal   Post-op Pain Management:    Induction:   Airway Management Planned: Simple Face Mask  Additional Equipment:   Intra-op Plan:   Post-operative Plan: Extubation in OR  Informed Consent: I have reviewed the patients History and Physical, chart, labs and discussed the procedure including the risks, benefits and alternatives for the proposed anesthesia with the patient or authorized representative who has indicated his/her understanding and acceptance.   Dental advisory given  Plan Discussed with: CRNA  Anesthesia Plan Comments:         Anesthesia Quick Evaluation

## 2012-03-07 NOTE — Progress Notes (Signed)
Utilization review completed.  

## 2012-03-07 NOTE — Preoperative (Signed)
Beta Blockers   Reason not to administer Beta Blockers:Not Applicable 

## 2012-03-07 NOTE — Plan of Care (Signed)
Problem: Consults Goal: Diagnosis- Total Joint Replacement Primary Total Hip     

## 2012-03-08 LAB — CBC
Hemoglobin: 8.9 g/dL — ABNORMAL LOW (ref 13.0–17.0)
MCV: 92.4 fL (ref 78.0–100.0)
Platelets: 211 10*3/uL (ref 150–400)
RBC: 2.9 MIL/uL — ABNORMAL LOW (ref 4.22–5.81)
WBC: 7.8 10*3/uL (ref 4.0–10.5)

## 2012-03-08 LAB — BASIC METABOLIC PANEL
CO2: 26 mEq/L (ref 19–32)
Chloride: 100 mEq/L (ref 96–112)
Glucose, Bld: 126 mg/dL — ABNORMAL HIGH (ref 70–99)
Sodium: 133 mEq/L — ABNORMAL LOW (ref 135–145)

## 2012-03-08 NOTE — Evaluation (Signed)
Physical Therapy Evaluation Patient Details Name: Blake Stark MRN: [id number] DOB: [birth year] Today's Date: 03/08/2012 Time: 4098-1191 PT Time Calculation (min): 37 min  PT Assessment / Plan / Recommendation Clinical Impression  78 yo male s/p R THA-direct anterior. Pt mobilizing fairly well. Some abdominal discomfort and nausea. Plans to d/c home with 24 hour supervision/assist.     PT Assessment  Patient needs continued PT services    Follow Up Recommendations  Home health PT    Barriers to Discharge        lEquipment Recommendations  None recommended by PT    Recommendations for Other Services OT consult   Frequency 7X/week    Precautions / Restrictions Precautions Precautions: Fall Restrictions Weight Bearing Restrictions: No RLE Weight Bearing: Weight bearing as tolerated   Pertinent Vitals/Pain 5/10 with activity R hip      Mobility  Bed Mobility Bed Mobility: Supine to Sit Supine to Sit: 4: Min assist;HOB elevated;With rails Details for Bed Mobility Assistance: A for R LE off bed Transfers Transfers: Sit to Stand;Stand to Sit Sit to Stand: 4: Min assist;From elevated surface;With upper extremity assist;From bed;From toilet Stand to Sit: 4: Min assist;To toilet;To chair/3-in-1;With upper extremity assist;With armrests Details for Transfer Assistance: VCs safety, technique, hand placement. A to rise, stabiilze, control descent.  Ambulation/Gait Ambulation/Gait Assistance: 4: Min guard Ambulation Distance (Feet): 135 Feet Assistive device: Rolling walker Ambulation/Gait Assistance Details: VCs safety, sequence. R foot/knee internally rotated during standing, ambulation.  Gait Pattern: Antalgic;Trunk flexed;Decreased stride length    Exercises     PT Diagnosis: Generalized weakness;Acute pain;Difficulty walking;Abnormality of gait  PT Problem List: Decreased strength;Decreased range of motion;Decreased activity tolerance;Decreased mobility;Decreased  balance;Decreased knowledge of use of DME;Pain PT Treatment Interventions: DME instruction;Gait training;Stair training;Functional mobility training;Therapeutic activities;Therapeutic exercise;Balance training;Patient/family education   PT Goals Acute Rehab PT Goals PT Goal Formulation: With patient Time For Goal Achievement: 03/15/12 Pt will go Supine/Side to Sit: with supervision PT Goal: Supine/Side to Sit - Progress: Goal set today Pt will go Sit to Supine/Side: with supervision PT Goal: Sit to Supine/Side - Progress: Goal set today Pt will go Sit to Stand: with supervision PT Goal: Sit to Stand - Progress: Goal set today Pt will Ambulate: >150 feet;with supervision;with least restrictive assistive device PT Goal: Ambulate - Progress: Goal set today Pt will Go Up / Down Stairs: 1-2 stairs;with least restrictive assistive device PT Goal: Up/Down Stairs - Progress: Goal set today  Visit Information  Last PT Received On: 03/08/12 Assistance Needed: +1    Subjective Data  Subjective: "Thank you.Marland KitchenMarland KitchenI really needed to get out of that bed" Patient Stated Goal: Home   Prior Functioning  Home Living Lives With: Spouse Available Help at Discharge: Personal care attendant;Available 24 hours/day Type of Home: House Home Access: Stairs to enter [employer] of Steps: 3 Entrance Stairs-Rails: None Home Layout: One level Firefighter: Handicapped height Home Adaptive Equipment: Wheelchair - manual;Bedside commode/3-in-1;Walker - rolling;Straight cane Prior Function Level of Independence: Independent Able to Take Stairs?: Yes Communication Communication: No difficulties    Cognition  Overall Cognitive Status: Appears within functional limits for tasks assessed/performed Arousal/Alertness: Awake/alert Orientation Level: Appears intact for tasks assessed Behavior During Session: [facility] for tasks performed    Extremity/Trunk Assessment Right Lower Extremity Assessment RLE  ROM/Strength/Tone: Deficits RLE ROM/Strength/Tone Deficits: Hip Abd/Add 2/5, hip flex 2/5, moves ankle well Left Lower Extremity Assessment LLE ROM/Strength/Tone: WFL for tasks assessed LLE Coordination: WFL - gross motor   Balance    End  of Session PT - End of Session Equipment Utilized During Treatment: Gait belt Activity Tolerance: Patient tolerated treatment well Patient left: in chair;with call bell/phone within reach   Rebeca Alert [facility] 03/08/2012, 12:19 PM [phone]

## 2012-03-08 NOTE — Progress Notes (Signed)
Physical Therapy Treatment Patient Details Name: Blake Stark MRN: [id number] DOB: Jan 23, 1927 Today's Date: 03/08/2012 Time: 4098-1191 PT Time Calculation (min): 24 min  PT Assessment / Plan / Recommendation Comments on Treatment Session  Continuing to mobilize well. Vomiting immediately before session but able to participate. Encouraged pt to work on keeping R LE straight during ambulation and exercises instead of allowing internal rotation.     Follow Up Recommendations  Home health PT    Barriers to Discharge        Equipment Recommendations  None recommended by PT    Recommendations for Other Services OT consult  Frequency 7X/week   Plan Discharge plan remains appropriate    Precautions / Restrictions Precautions Precautions: Fall Restrictions Weight Bearing Restrictions: No RLE Weight Bearing: Weight bearing as tolerated   Pertinent Vitals/Pain     Mobility  Bed Mobility Bed Mobility: Sit to Supine Sit to Supine: 4: Min assist Details for Bed Mobility Assistance: A for R LE onto bed Transfers Transfers: Stand to Sit;Sit to Stand Sit to Stand: 4: Min assist;With upper extremity assist;With armrests Stand to Sit: 4: Min assist;To chair/3-in-1;With upper extremity assist Details for Transfer Assistance: VCs safety, hand placement. A to rise, control descent Ambulation/Gait Ambulation/Gait Assistance: 4: Min guard Ambulation Distance (Feet): 135 Feet Assistive device: Rolling walker Ambulation/Gait Assistance Details: VCs safety, sequence. Pt still with internally rotated R LE.  Gait Pattern: Step-to pattern;Trunk flexed;Decreased step length - right;Decreased stride length;Decreased step length - left    Exercises Total Joint Exercises Ankle Circles/Pumps: AROM;Both;10 reps;Supine Quad Sets: AROM;Both;10 reps;Supine Short Arc Quad: AROM;Right;10 reps;Supine Heel Slides: AAROM;10 reps;Supine Hip ABduction/ADduction: AAROM;10 reps;Supine   PT Diagnosis:  Generalized weakness;Acute pain;Difficulty walking;Abnormality of gait  PT Problem List: Decreased strength;Decreased range of motion;Decreased activity tolerance;Decreased mobility;Decreased balance;Decreased knowledge of use of DME;Pain PT Treatment Interventions: DME instruction;Gait training;Stair training;Functional mobility training;Therapeutic activities;Therapeutic exercise;Balance training;Patient/family education   PT Goals Acute Rehab PT Goals PT Goal Formulation: With patient Time For Goal Achievement: 03/15/12 Pt will go Supine/Side to Sit: with supervision PT Goal: Supine/Side to Sit - Progress: Goal set today Pt will go Sit to Supine/Side: with supervision PT Goal: Sit to Supine/Side - Progress: Progressing toward goal Pt will go Sit to Stand: with supervision PT Goal: Sit to Stand - Progress: Progressing toward goal Pt will Ambulate: >150 feet;with supervision;with least restrictive assistive device PT Goal: Ambulate - Progress: Progressing toward goal Pt will Go Up / Down Stairs: 1-2 stairs;with least restrictive assistive device PT Goal: Up/Down Stairs - Progress: Goal set today  Visit Information  Last PT Received On: 03/08/12 Assistance Needed: +1    Subjective Data  Subjective: "I just got sick....its so hot in here" Patient Stated Goal: Home. Walk better   Cognition  Overall Cognitive Status: Appears within functional limits for tasks assessed/performed Arousal/Alertness: Awake/alert Orientation Level: Appears intact for tasks assessed Behavior During Session: [facility] for tasks performed    Balance     End of Session PT - End of Session Equipment Utilized During Treatment: Gait belt Activity Tolerance: Patient tolerated treatment well Patient left: in bed;with call bell/phone within reach;with family/visitor present    Rebeca Alert [facility] 03/08/2012, 2:56 PM [phone]

## 2012-03-08 NOTE — Op Note (Signed)
NAMEOMARIAN, Blake Stark             ACCOUNT NO.:  [id number]  MEDICAL RECORD NO.:  [id number]  LOCATION:  1601                         FACILITY:  [facility]  PHYSICIAN:  Vanita Panda. Magnus Ivan, M.D.DATE OF BIRTH:  1926/12/31  DATE OF PROCEDURE:  03/07/2012 DATE OF DISCHARGE:                              OPERATIVE REPORT   PREOPERATIVE DIAGNOSIS:  Severe end-stage arthritis, right hip.  POSTOPERATIVE DIAGNOSIS:  Severe end-stage arthritis, right hip.  PROCEDURE:  Right total hip arthroplasty through direct anterior approach.  IMPLANTS:  DePuy sector Gription acetabular component size 52, size 36 + 4 neutral polyethylene liner, a Corail femoral component size 14 with standard offset, size 36 + 8.5 metal hip ball.  SURGEON:  Vanita Panda. Magnus Ivan, M.D.  ANESTHESIA:  Spinal.  BLOOD LOSS:  850 mL.  ANTIBIOTICS:  2 g IV Ancef.  COMPLICATIONS:  None.  INDICATIONS:  Blake Stark is an 78 year old very active individual, takes care of elderly wife who is in poor health status.  He has had hip pain for several years now.  This worsened over the last several months and he has radiographic evidence of a collapsed femoral head and severe bone on bone wear.  He was hunched over his quality of life.  His __________ pain is daily.  He wishes at this point to proceed with a total hip arthroplasty.  The risks and benefits of this were explained to him in detail and he does wish to proceed with surgery.  PROCEDURE DESCRIPTION:  After informed consent was obtained, appropriate right hip was marked.  He was brought to the operating room and spinal anesthesia obtained while he was on the stretcher.  A Foley catheter was then placed and then traction boots were placed on both his feet so he could be placed supine on the hana fracture table.  Perineal post was placed and both feet were placed inline skeletal traction, but no traction applied.  His right operative hip was then prepped and  draped with DuraPrep and sterile drapes.  A time-out was called, identified the correct patient and the correct right hip.  I then made an incision just distal and posterior to the anterior-superior iliac spine and carried this obliquely down the anterior thigh.  I dissected down to the tensor fascia lata, and the tensor fascia was divided obliquely to gain exposure to the anterior hip capsule.  Cobra retractors placed laterally, coagulated lateral femoral circumflex vessels and then a cobra retractor was placed medially up underneath the rectus femoris.  I gained access to the hip capsule then by dividing this and encountered a large effusion from the hip joint.  I then placed the cobra retractors within the hip capsule.  I made my femoral neck cut with oscillating saw just proximal to the lesser trochanter.  I used a corkscrew guide to then move the femoral head in its entirety.  I cleaned the acetabulum of debris and then placed a bent Hohmann medially and a cobra retractor laterally.  I began reaming from size 44 reamer in 2 mm increments, all the way up to size 52 with last 2 reamers placed under direct fluoroscopy as well as visualization, so I obtained my depth of  reaming, my inclination, abduction, and version.  I then placed the real 52 acetabular component with Gription and knocked this in the place into the pelvis.  It was stable, so I did not need to place any screw holes. I placed the whole eliminator guide and the real 36 + 4 neutral polyethylene liner.  Attention was then turned to the femur.  With all traction off the leg, we externally rotated to 90 degrees, extended and adducted the leg.  I placed a Mueller retractor medially and a retractor underneath the greater trochanter.  I released the lateral hip capsule and then used a box cutting guide to open up the femoral canal, began broaching from a size 8 broach all the way to a size 14.  The 14 was found to be stable and  I used a calcar planer, and then trialed several hip balls, so I felt the 8.5 was the most stable with internal external rotation, minimal shock; and his leg lengths under direct fluoroscopy were equal once we brought the leg back up over and located the hip.  I then dislocated the hip and removed all trial components.  I placed the real Corail femoral component, size 14 with HA coating and a collar.  I placed the real 36 + 8.5 metal hip ball.  We reduced this in the acetabulum that was stable.  I copiously irrigated the soft tissues with normal saline solution and closed the joint capsule with interrupted #1 Ethibond suture.  I used a running V-lock 0 suture in the tensor fascia lata, 2-0 Vicryl for subcutaneous tissue, and staples on the skin. Xeroform followed by a well-padded sterile dressing was applied and the patient was taken off the hana table to the recovery room in stable condition.  All final counts were correct.  There were no complications noted.     Vanita Panda. Magnus Ivan, M.D.     CYB/MEDQ  D:  03/07/2012  T:  03/08/2012  Job:  [id number]

## 2012-03-08 NOTE — Progress Notes (Signed)
Subjective: 1 Day Post-Op Procedure(s) (LRB): TOTAL HIP ARTHROPLASTY ANTERIOR APPROACH (Right) Patient reports pain as moderate.   Acute blood loss anemia. Objective: Vital signs in last 24 hours: Temp:  [97 F (36.1 C)-98 F (36.7 C)] 98 F (36.7 C) (06/01 0615) Pulse Rate:  [47-66] 60  (06/01 0615) Resp:  [16-21] 16  (06/01 0615) BP: (118-157)/(43-70) 129/70 mmHg (06/01 0615) SpO2:  [95 %-100 %] 100 % (06/01 0615) Weight:  [69.854 kg (154 lb)] 69.854 kg (154 lb) (05/31 1315)  Intake/Output from previous day: 05/31 0701 - 06/01 0700 In: 2636.3 [P.O.:360; I.V.:2176.3; IV Piggyback:100] Out: 2150 [Urine:1300; Blood:850] Intake/Output this shift:     Basename 03/08/12 0351 03/05/12 1450  HGB 8.9* 11.6*    Basename 03/08/12 0351 03/05/12 1450  WBC 7.8 5.9  RBC 2.90* 3.84*  HCT 26.8* 35.9*  PLT 211 275    Basename 03/08/12 0351 03/05/12 1450  NA 133* 137  K 5.0 4.7  CL 100 100  CO2 26 29  BUN 16 26*  CREATININE 0.98 1.28  GLUCOSE 126* 97  CALCIUM 8.3* 9.2   No results found for this basename: LABPT:2,INR:2 in the last 72 hours  Sensation intact distally Intact pulses distally Dorsiflexion/Plantar flexion intact Incision: scant drainage  Assessment/Plan: 1 Day Post-Op Procedure(s) (LRB): TOTAL HIP ARTHROPLASTY ANTERIOR APPROACH (Right) Up with therapy, WBAT right hip. Watch Hgb/Hct  Kathryne Hitch 03/08/2012, 7:08 AM

## 2012-03-09 LAB — CBC
HCT: 24.3 % — ABNORMAL LOW (ref 39.0–52.0)
MCV: 91.4 fL (ref 78.0–100.0)
RBC: 2.66 MIL/uL — ABNORMAL LOW (ref 4.22–5.81)
WBC: 8.1 10*3/uL (ref 4.0–10.5)

## 2012-03-09 MED ORDER — BISACODYL 10 MG RE SUPP
10.0000 mg | Freq: Every day | RECTAL | Status: DC | PRN
  Administered 2012-03-09: 10 mg via RECTAL
  Filled 2012-03-09: qty 1

## 2012-03-09 NOTE — Evaluation (Signed)
Occupational Therapy Evaluation Patient Details Name: Blake Stark MRN: [id number] DOB: 04/16/1927 Today's Date: 03/09/2012 Time: 4098-1191 OT Time Calculation (min): 25 min  OT Assessment / Plan / Recommendation Clinical Impression  Pt presents to OT with decreased  I with ADL activity s/p THA. Pt will benefit from skilled  OT to increase I with  ADL activity and return to PLOF    OT Assessment  Patient needs continued OT Services    Follow Up Recommendations  Home health OT       Equipment Recommendations  None recommended by OT       Frequency  Min 2X/week    Precautions / Restrictions Restrictions Weight Bearing Restrictions: No RLE Weight Bearing: Weight bearing as tolerated       ADL  Grooming: Performed Where Assessed - Grooming: Unsupported standing Upper Body Bathing: Simulated;Set up Where Assessed - Upper Body Bathing: Unsupported sitting Lower Body Bathing: Simulated;Minimal assistance Where Assessed - Lower Body Bathing: Unsupported sit to stand Upper Body Dressing: Simulated;Set up Where Assessed - Upper Body Dressing: Unsupported sitting Lower Body Dressing: Moderate assistance Where Assessed - Lower Body Dressing: Unsupported sit to stand Toilet Transfer: Performed Toilet Transfer Method: Sit to stand Toilet Transfer Equipment: Regular height toilet Toileting - Clothing Manipulation and Hygiene: Performed;Min guard Where Assessed - Engineer, mining and Hygiene: Standing Tub/Shower Transfer: Performed;Min guard Tub/Shower Transfer Method: Ambulating    OT Diagnosis: Generalized weakness  OT Problem List: Decreased strength OT Treatment Interventions: Self-care/ADL training;DME and/or AE instruction;Patient/family education   OT Goals Acute Rehab OT Goals OT Goal Formulation: With patient Time For Goal Achievement: 03/16/12 Potential to Achieve Goals: Good ADL Goals Pt Will Perform Lower Body Dressing: with modified  independence ADL Goal: Lower Body Dressing - Progress: Goal set today Pt Will Perform Toileting - Clothing Manipulation: with modified independence ADL Goal: Toileting - Clothing Manipulation - Progress: Goal set today Pt Will Perform Tub/Shower Transfer: Shower transfer;with modified independence ADL Goal: Tub/Shower Transfer - Progress: Goal set today  Visit Information  Last OT Received On: 03/09/12          Cognition  Overall Cognitive Status: Appears within functional limits for tasks assessed/performed Arousal/Alertness: Awake/alert Orientation Level: Appears intact for tasks assessed Behavior During Session: [facility] for tasks performed    Extremity/Trunk Assessment Right Upper Extremity Assessment RUE ROM/Strength/Tone: [facility] for tasks assessed Left Upper Extremity Assessment LUE ROM/Strength/Tone: WFL for tasks assessed   Mobility Bed Mobility Bed Mobility: Sit to Supine Supine to Sit: 4: Min assist Details for Bed Mobility Assistance: A for R LE onto bed Transfers Sit to Stand: 4: Min assist;With upper extremity assist;With armrests Stand to Sit: 4: Min assist;To chair/3-in-1;With upper extremity assist Details for Transfer Assistance: VCs safety, hand placement. A to rise, control descent         End of Session OT - End of Session Activity Tolerance: Patient tolerated treatment well Patient left: in chair   Kadence Mimbs, Metro Kung 03/09/2012, 10:47 AM

## 2012-03-09 NOTE — Progress Notes (Signed)
Physical Therapy Treatment Patient Details Name: Blake Stark MRN: [id number] DOB: 01/11/1927 Today's Date: 03/09/2012 Time: 0865-7846 PT Time Calculation (min): 18 min  PT Assessment / Plan / Recommendation Comments on Treatment Session  Pt c/o nausea today. He attempted to have BM but only passed "water". RN notified. Doing well with ambulation, transfers, and exercises.     Follow Up Recommendations  Home health PT    Barriers to Discharge        Equipment Recommendations       Recommendations for Other Services OT consult  Frequency 7X/week   Plan Discharge plan remains appropriate    Precautions / Restrictions Restrictions Weight Bearing Restrictions: No RLE Weight Bearing: Weight bearing as tolerated   Pertinent Vitals/Pain *2/10 R hip Ice applied Pt c/o nausea, RN notified**    Mobility  Bed Mobility Bed Mobility: Sit to Supine Supine to Sit: 4: Min assist Details for Bed Mobility Assistance: A for R LE onto bed Transfers Sit to Stand: With upper extremity assist;With armrests;4: Min guard;From chair/3-in-1 Stand to Sit: With upper extremity assist;To bed;4: Min guard Ambulation/Gait Ambulation/Gait Assistance: 5: Supervision Ambulation Distance (Feet): 150 Feet Assistive device: Rolling walker    Exercises Total Joint Exercises Ankle Circles/Pumps: AROM;Both;10 reps;Supine Quad Sets: AROM;Both;10 reps;Supine Short Arc Quad: AROM;Right;10 reps;Supine Heel Slides: AAROM;10 reps;Supine;Right Hip ABduction/ADduction: AAROM;10 reps;Supine;Right   PT Diagnosis:    PT Problem List:   PT Treatment Interventions:     PT Goals Acute Rehab PT Goals PT Goal Formulation: With patient Time For Goal Achievement: 03/15/12 Potential to Achieve Goals: Good Pt will go Sit to Supine/Side: with supervision PT Goal: Sit to Supine/Side - Progress: Progressing toward goal Pt will go Sit to Stand: with supervision PT Goal: Sit to Stand - Progress: Progressing toward  goal Pt will Ambulate: >150 feet;with supervision;with least restrictive assistive device PT Goal: Ambulate - Progress: Progressing toward goal  Visit Information  Last PT Received On: 03/09/12 Assistance Needed: +1 Reason Eval/Treat Not Completed: Medical issues which prohibited therapy (nausea, constipation)    Subjective Data  Subjective: My hip is good, I just feel sick and nauseous. I'm sorry I'm such a bother to everyone. Patient Stated Goal: decrease nausea   Cognition  Overall Cognitive Status: Appears within functional limits for tasks assessed/performed Arousal/Alertness: Awake/alert Orientation Level: Appears intact for tasks assessed Behavior During Session: [facility] for tasks performed    Balance     End of Session PT - End of Session Activity Tolerance: Patient tolerated treatment well Patient left: in bed Nurse Communication: Mobility status    Ralene Bathe Kistler 03/09/2012, 2:29 PM [phone]

## 2012-03-09 NOTE — Progress Notes (Addendum)
CM spoke with pt concerning dc planning. Per pt choice [facility] to provide [facility] services upon dc. [facility] notified of referral. Pt has private duty aides that come in during the day to assist him and his wife.    Leonie Green [phone]

## 2012-03-09 NOTE — Progress Notes (Signed)
Pt given Dulcolax suppository. Incontinent of  Explosive watery stool and subsequent severe nausea with greenish emesis. Pt has been treated with antiemetics since admission to unit. Abdomen soft with good BS. Denies tenderness. Called Rise Paganini, MD who is in surgery will speak with him at 1300 when he completes surgery and rounds on floor for Magnus Ivan, MD. Will hold meal for now

## 2012-03-09 NOTE — Progress Notes (Signed)
Subjective: Hip feels good - nausea persists   Objective: Vital signs in last 24 hours: Temp:  [98.1 F (36.7 C)-99.2 F (37.3 C)] 98.1 F (36.7 C) (06/02 1405) Pulse Rate:  [59-79] 61  (06/02 1405) Resp:  [15-16] 16  (06/02 1405) BP: (114-136)/(64-76) 136/76 mmHg (06/02 1405) SpO2:  [94 %-100 %] 94 % (06/02 1405)  Intake/Output from previous day: 06/01 0701 - 06/02 0700 In: 2038.6 [P.O.:1080; I.V.:956.6; IV Piggyback:2] Out: 150 [Urine:150] Intake/Output this shift: Total I/O In: 242 [P.O.:240; IV Piggyback:2] Out: -   Exam:  ABD soft Neurovascular intact Sensation intact distally Intact pulses distally  Labs:  Basename 03/09/12 0420 03/08/12 0351  HGB 8.2* 8.9*    Basename 03/09/12 0420 03/08/12 0351  WBC 8.1 7.8  RBC 2.66* 2.90*  HCT 24.3* 26.8*  PLT 180 211    Basename 03/08/12 0351  NA 133*  K 5.0  CL 100  CO2 26  BUN 16  CREATININE 0.98  GLUCOSE 126*  CALCIUM 8.3*   No results found for this basename: LABPT:2,INR:2 in the last 72 hours  Assessment/Plan: Progressing well - probable Tuesday dc - hgb stable   Rashell Shambaugh SCOTT 03/09/2012, 3:00 PM

## 2012-03-09 NOTE — Progress Notes (Signed)
PT Cancellation Note  Treatment cancelled today due to medical issues with patient which prohibited therapy.Pt stated he feels nauseous and can't tolerate walking right now.  He states he had anti-nausea meds. RN notified. Will follow.  Ralene Bathe Kistler 03/09/2012, 11:45 AM [phone]

## 2012-03-10 LAB — CBC
HCT: 23.6 % — ABNORMAL LOW (ref 39.0–52.0)
Hemoglobin: 8 g/dL — ABNORMAL LOW (ref 13.0–17.0)
MCH: 30.9 pg (ref 26.0–34.0)
MCHC: 33.9 g/dL (ref 30.0–36.0)
MCV: 91.1 fL (ref 78.0–100.0)

## 2012-03-10 MED ORDER — FUROSEMIDE 10 MG/ML IJ SOLN
20.0000 mg | Freq: Once | INTRAMUSCULAR | Status: AC
  Administered 2012-03-10: 20 mg via INTRAVENOUS
  Filled 2012-03-10: qty 2

## 2012-03-10 MED ORDER — FLEET ENEMA 7-19 GM/118ML RE ENEM
1.0000 | ENEMA | Freq: Every day | RECTAL | Status: DC | PRN
  Administered 2012-03-10: 1 via RECTAL
  Filled 2012-03-10: qty 1

## 2012-03-10 MED ORDER — FUROSEMIDE 10 MG/ML IJ SOLN
20.0000 mg | Freq: Once | INTRAMUSCULAR | Status: DC
  Filled 2012-03-10: qty 2

## 2012-03-10 MED ORDER — SODIUM CHLORIDE 0.9 % IV BOLUS (SEPSIS): 500.0000 mL | Freq: Once | INTRAVENOUS | Status: DC

## 2012-03-10 NOTE — Progress Notes (Signed)
Physical Therapy Treatment Patient Details Name: Blake Stark MRN: [id number] DOB: 05/04/1927 Today's Date: 03/10/2012 Time: 4098-1191 PT Time Calculation (min): 25 min  PT Assessment / Plan / Recommendation Comments on Treatment Session  Pt finished first unit of blood so assisted him OOB to amb then assisted him to BR then assisted him back to bed per pt request/comfort.  Pt plans to D/c to home tomorrow.    Follow Up Recommendations  Home health PT    Barriers to Discharge        Equipment Recommendations  Rolling walker with 5" wheels;Other (comment) (stated the one @ home is too wide and belonged to another)    Recommendations for Other Services    Frequency 7X/week   Plan Discharge plan remains appropriate    Precautions / Restrictions NONE Direct Anterior Approach THR WBAT   Pertinent Vitals/Pain C/o 5/10 pain, ICE applied     Mobility  Bed Mobility Bed Mobility: Supine to Sit;Sit to Supine Supine to Sit: 4: Min guard Sit to Supine: 4: Min guard Details for Bed Mobility Assistance: Min assist to support R LE up in bed Transfers Transfers: Sit to Stand;Stand to Sit Sit to Stand: 4: Min guard;From chair/3-in-1;From toilet;From bed Stand to Sit: 4: Min guard;To bed;To toilet Details for Transfer Assistance: <25% VC's on safety with turns Ambulation/Gait Ambulation/Gait Assistance: 4: Min guard Ambulation Distance (Feet): 145 Feet Assistive device: Rolling walker Ambulation/Gait Assistance Details: <25% VC's on safety and proper walker to self distance Gait Pattern: Step-through pattern;Trunk flexed;Decreased stance time - right Stairs: No Wheelchair Mobility Wheelchair Mobility: No       Visit Information  Last PT Received On: 03/10/12 Assistance Needed: +1    Subjective Data  Subjective: There giving me another unit of blood Patient Stated Goal: home   Cognition    good   Balance   fair +  End of Session PT - End of Session Equipment  Utilized During Treatment: Gait belt Activity Tolerance: Patient tolerated treatment well Patient left: in bed;with family/visitor present  Felecia Shelling  PTA [facility]  Acute  Rehab Pager     [phone]

## 2012-03-10 NOTE — Progress Notes (Signed)
Subjective: 3 Days Post-Op Procedure(s) (LRB): TOTAL HIP ARTHROPLASTY ANTERIOR APPROACH (Right) Patient reports pain as mild.  No BM.  Acute blood loss anemia  Objective: Vital signs in last 24 hours: Temp:  [98 F (36.7 C)-98.6 F (37 C)] 98 F (36.7 C) (06/03 0619) Pulse Rate:  [61-65] 64  (06/03 0619) Resp:  [16-19] 16  (06/03 0619) BP: (115-136)/(62-76) 131/62 mmHg (06/03 0619) SpO2:  [94 %-97 %] 96 % (06/03 0619)  Intake/Output from previous day: 06/02 0701 - 06/03 0700 In: 1362 [P.O.:1360; IV Piggyback:2] Out: -  Intake/Output this shift: Total I/O In: 880 [P.O.:880] Out: -    Basename 03/10/12 0405 03/09/12 0420 03/08/12 0351  HGB 8.0* 8.2* 8.9*    Basename 03/10/12 0405 03/09/12 0420  WBC 8.0 8.1  RBC 2.59* 2.66*  HCT 23.6* 24.3*  PLT 185 180    Basename 03/08/12 0351  NA 133*  K 5.0  CL 100  CO2 26  BUN 16  CREATININE 0.98  GLUCOSE 126*  CALCIUM 8.3*   No results found for this basename: LABPT:2,INR:2 in the last 72 hours  Sensation intact distally Intact pulses distally Dorsiflexion/Plantar flexion intact Incision: scant drainage  Assessment/Plan: 3 Days Post-Op Procedure(s) (LRB): TOTAL HIP ARTHROPLASTY ANTERIOR APPROACH (Right) Plan for discharge tomorrow Transfusion today. Enema today.  Kathryne Hitch 03/10/2012, 6:42 AM

## 2012-03-10 NOTE — Progress Notes (Signed)
Physical Therapy Treatment Patient Details Name: Blake Stark MRN: [id number] DOB: 12/23/1926 Today's Date: 03/10/2012 Time: 1324-4010 PT Time Calculation (min): 20 min   PT Assessment / Plan / Recommendation Comments on Treatment Session  Pt on BSC on arrival.  Assited pt off BSC to ambulate, then assisted back to bed per pt request.    Follow Up Recommendations  Home health PT    Barriers to Discharge        Equipment Recommendations  Rolling walker with 5" wheels;Other (comment) (stated the one @ home is too wide and belonged to another)    Recommendations for Other Services    Frequency 7X/week   Plan Discharge plan remains appropriate    Precautions / Restrictions NONE WBAT Direct Anterior Approach THR   Pertinent Vitals/Pain C/o "soreness" and inability to have a BM    Mobility  Bed Mobility Bed Mobility: Sit to Supine Supine to Sit: 4: Min guard Sit to Supine: 4: Min guard Details for Bed Mobility Assistance: Pt on BSC on arrival c/o inability to have a BM, NT aware and encouraged amb to pt Transfers Transfers: Sit to Stand;Stand to Sit Sit to Stand: 4: Min guard;From chair/3-in-1 Stand to Sit: 4: Min guard;To bed Details for Transfer Assistance: one VC on safety with turns Ambulation/Gait Ambulation/Gait Assistance: 4: Min guard Ambulation Distance (Feet): 135 Feet Assistive device: Rolling walker Ambulation/Gait Assistance Details: <25% VC's on safety and proper walker to self distance  Gait Pattern: Step-through pattern;Decreased stride length;Trunk flexed;Decreased stance time - right Stairs: No Wheelchair Mobility Wheelchair Mobility: No      Visit Information  Last PT Received On: 03/10/12 Assistance Needed: +1    Subjective Data  Subjective: I'm cold Patient Stated Goal: n/a   Cognition   good   Balance   fair+  End of Session PT - End of Session Equipment Utilized During Treatment: Gait belt Activity Tolerance: Treatment limited  secondary to medical complications (Comment); discomfort/inability to have BM) Patient left: in bed;with family/visitor present Nurse Communication: Other (comment) (pt discomfort)   Felecia Shelling  PTA [facility]  Acute  Rehab Pager     [phone]

## 2012-03-10 NOTE — Progress Notes (Signed)
Occupational Therapy Treatment Patient Details Name: Blake Stark MRN: [id number] DOB: 1927/05/06 Today's Date: 03/10/2012 Time: 4098-1191 OT Time Calculation (min): 15 min   Follow Up Recommendations  Home health OT                   Precautions / Restrictions Precautions Precautions: Fall Restrictions Weight Bearing Restrictions: No       ADL  Where Assessed - Upper Body Dressing: Unsupported sit to stand Lower Body Dressing: Moderate assistance;Other (comment) (AE instruction, will need further education) Toilet Transfer Method: Sit to stand;Other (comment) (ambulating) Toilet Transfer Equipment: Raised toilet seat with arms (or 3-in-1 over toilet) Toileting - Clothing Manipulation and Hygiene: Performed;Minimal assistance Where Assessed - Toileting Clothing Manipulation and Hygiene: Standing ADL Comments: Pt has significant headache this morning. Pt ask for OT to re educate on AE when feeling better. Noted pt to get blood today.      OT Goals ADL Goals ADL Goal: Lower Body Dressing - Progress: Progressing toward goals ADL Goal: Toileting - Clothing Manipulation - Progress: Progressing toward goals  Visit Information  Last OT Received On: 03/10/12          Cognition  Overall Cognitive Status: Appears within functional limits for tasks assessed/performed Arousal/Alertness: Awake/alert Orientation Level: Appears intact for tasks assessed Behavior During Session: [facility] for tasks performed    Mobility Bed Mobility Bed Mobility: Supine to Sit Supine to Sit: 5: Supervision;HOB flat;With rails Sit to Supine: 5: Set up Transfers Transfers: Sit to Stand;Stand to Sit Sit to Stand: 4: Min guard Stand to Sit: 4: Min guard         End of Session OT - End of Session Activity Tolerance: Patient limited by fatigue;Patient limited by pain Patient left: in bed;with call bell/phone within reach   [facility], Karin Golden D 03/10/2012, 9:18 AM

## 2012-03-11 ENCOUNTER — Encounter (HOSPITAL_COMMUNITY): Payer: Self-pay | Admitting: Orthopaedic Surgery

## 2012-03-11 LAB — TYPE AND SCREEN
Antibody Screen: NEGATIVE
Unit division: 0

## 2012-03-11 MED ORDER — METHOCARBAMOL 500 MG PO TABS: 500.0000 mg | ORAL_TABLET | Freq: Four times a day (QID) | ORAL | Status: AC | PRN

## 2012-03-11 MED ORDER — RIVAROXABAN 10 MG PO TABS: 10.0000 mg | ORAL_TABLET | Freq: Every day | ORAL | Status: DC

## 2012-03-11 MED ORDER — OXYCODONE-ACETAMINOPHEN 5-325 MG PO TABS
1.0000 | ORAL_TABLET | ORAL | Status: AC | PRN
Start: 2012-03-11 — End: 2012-03-21

## 2012-03-11 NOTE — Care Management Note (Signed)
    Page 1 of 2   03/11/2012     2:36:47 PM   CARE MANAGEMENT NOTE 03/11/2012  Patient:  Blake Stark, Blake Stark   Account Number:  [id number]  Date Initiated:  03/09/2012  Documentation initiated by:  DAVIS,TYMEEKA  Subjective/Objective Assessment:   78 yo male admitted s/p right hip replacement.     Action/Plan:   Home when stable   Anticipated DC Date:  03/10/2012   Anticipated DC Plan:  HOME W HOME HEALTH SERVICES  In-house referral  NA      DC Planning Services  CM consult      [facility] Ed Choice  HOME HEALTH   Choice offered to / List presented to:  C-1 Patient   DME arranged  Levan Hurst      DME agency  [facility]     HH arranged  HH-2 PT      [facility] agency  [facility]   Status of service:  Completed, signed off Medicare Important Message given?  NO (If response is "NO", the following Medicare IM given date fields will be blank) Date Medicare IM given:   Date Additional Medicare IM given:    Discharge Disposition:  HOME W HOME HEALTH SERVICES  Per UR Regulation:    If discussed at Long Length of Stay Meetings, dates discussed:    Comments:  03/11/2012 Raynelle Bring BSN CCM [phone] order for [facility] notified and will deliver to room. Pt for discharge today with [facility] in place. Services will start tomorrow .  03/09/12 1152 Tymeeka Davis,Rn,BSN [phone] CM spoke with pt concerning dc planning. Per pt choice [facility] to provide [facility] services upon dc. [facility] notified of referral. Pt has private duty aides that come in during the day to assist him and his wife.

## 2012-03-11 NOTE — Progress Notes (Signed)
Physical Therapy Treatment Patient Details Name: Blake Stark MRN: [id number] DOB: December 18, 1926 Today's Date: 03/11/2012 Time: 1020-1029 PT Time Calculation (min): 9 min  PT Assessment / Plan / Recommendation Comments on Treatment Session  With son present, practiced stair training.  Pt plans to D/C to home today.    Follow Up Recommendations  Home health PT    Barriers to Discharge        Equipment Recommendations  Rolling walker with 5" wheels;Other (comment)    Recommendations for Other Services    Frequency 7X/week   Plan Discharge plan remains appropriate    Precautions / Restrictions Precautions Precautions: Fall Restrictions RLE Weight Bearing: Weight bearing as tolerated   Pertinent Vitals/Pain "soreness'    Mobility  Bed Mobility Bed Mobility: Sit to Supine;Supine to Sit Supine to Sit: 5: Supervision;With rails;HOB elevated Details for Bed Mobility Assistance: Pt OOB in recliner Transfers Transfers: Sit to Stand;Stand to Sit Sit to Stand: 5: Supervision;From chair/3-in-1 Stand to Sit: 5: Supervision;To chair/3-in-1 Details for Transfer Assistance: good use of hands and safety Ambulation/Gait Ambulation/Gait Assistance: 5: Supervision Ambulation Distance (Feet): 40 Feet Assistive device: Rolling walker Ambulation/Gait Assistance Details: amb to and from stairs only this am Gait Pattern: Step-to pattern;Decreased stride length;Trunk flexed;Decreased stance time - right Stairs: Yes Stairs Assistance: 4: Min guard Stair Management Technique: No rails;Forwards;With walker;Other (comment) (pt insisted on going up forward using RW, advised son to [place]) Number of Stairs: 2  Wheelchair Mobility Wheelchair Mobility: No      PT Goals                                                     progressing    Visit Information  Last PT Received On: 03/11/12 Assistance Needed: +1    Subjective Data  Subjective: I'm going home today Patient Stated Goal: n/a     Cognition  Overall Cognitive Status: Appears within functional limits for tasks assessed/performed Behavior During Session: [facility] for tasks performed    Balance   fair +  End of Session PT - End of Session Equipment Utilized During Treatment: Gait belt Activity Tolerance: Patient tolerated treatment well Patient left: in chair;with call bell/phone within reach;with family/visitor present   Felecia Shelling  PTA [facility]  Acute  Rehab Pager     [phone]

## 2012-03-11 NOTE — Evaluation (Signed)
Occupational Therapy Evaluation Patient Details Name: Blake Stark MRN: [id number] DOB: August 04, 1927 Today's Date: 03/11/2012 Time: 4098-1191 OT Time Calculation (min): 26 min  OT Assessment / Plan / Recommendation Clinical Impression       OT Assessment       Follow Up Recommendations  Home health OT    Barriers to Discharge      Equipment Recommendations  Rolling walker with 5" wheels;Other (comment)    Recommendations for Other Services    Frequency  Min 2X/week    Precautions / Restrictions Precautions Precautions: Fall Restrictions RLE Weight Bearing: Weight bearing as tolerated       ADL  Lower Body Dressing: Performed;Minimal assistance (underwear, pants, slip on shoes--did not want to use AE) Where Assessed - Lower Body Dressing: Unsupported sit to stand Toilet Transfer: Performed;Min guard Toilet Transfer Method:  (ambulate) Acupuncturist: Comfort height toilet;Grab bars Tub/Shower Transfer:  (reviewed sequence only) ADL Comments: pt with nausea this am.  Feels he doesn't need AE: occasional min A given during tx...pt may have been able to don clothes with a little extra time but feeling nauseaus this am.    OT Diagnosis:    OT Problem List:   OT Treatment Interventions:     OT Goals Acute Rehab OT Goals Time For Goal Achievement: 03/16/12 ADL Goals Pt Will Perform Lower Body Dressing: with modified independence ADL Goal: Lower Body Dressing - Progress: Progressing toward goals Pt Will Transfer to Toilet: with modified independence;3-in-1 ADL Goal: Toilet Transfer - Progress: Goal set today  Visit Information  Last OT Received On: 03/11/12 Assistance Needed: +1    Subjective Data  Subjective: I feel like I did before the surgery (for ADLs)   Prior Functioning       Cognition  Overall Cognitive Status: Appears within functional limits for tasks assessed/performed Behavior During Session: [facility] for tasks performed    Extremity/Trunk  Assessment     Mobility Bed Mobility Bed Mobility: Sit to Supine;Supine to Sit Supine to Sit: 5: Supervision;With rails;HOB elevated Transfers Sit to Stand: 5: Supervision;With armrests;From bed;From chair/3-in-1;From toilet   Exercise    Balance    End of Session OT - End of Session Activity Tolerance: Patient limited by fatigue Patient left: in chair;with call bell/phone within reach   [facility] 03/11/2012, 9:53 AM Marica Otter, OTR/L [phone] 03/11/2012

## 2012-03-11 NOTE — Discharge Summary (Signed)
Patient ID: Blake Stark MRN: [id number] DOB/AGE: [birth year] 78 y.o.  Admit date: 03/07/2012 Discharge date: 03/11/2012  Admission Diagnoses:  Principal Problem:  *Degenerative arthritis of hip   Discharge Diagnoses:  Same  Past Medical History  Diagnosis Date  . Cancer 03-05-12    Prostate cancer  . Hypertension   . Dysrhythmia 03-05-12    occ. skip beat, hx. RBBB  . Arthritis 03-05-12    severe arthritis right hip  . Blood transfusion 03-05-12    tx. ulcer-many yrs ago.    Surgeries: Procedure(s): TOTAL HIP ARTHROPLASTY ANTERIOR APPROACH on 03/07/2012   Consultants:    Discharged Condition: Improved  Hospital Course: Blake Stark is an 78 y.o. male who was admitted 03/07/2012 for operative treatment ofDegenerative arthritis of hip. Patient has severe unremitting pain that affects sleep, daily activities, and work/hobbies. After pre-op clearance the patient was taken to the operating room on 03/07/2012 and underwent  Procedure(s): TOTAL HIP ARTHROPLASTY ANTERIOR APPROACH.    Patient was given perioperative antibiotics: Anti-infectives     Start     Dose/Rate Route Frequency Ordered Stop   03/07/12 1500   ceFAZolin (ANCEF) IVPB 1 g/50 mL premix        1 g 100 mL/hr over 30 Minutes Intravenous Every 6 hours 03/07/12 1340 03/08/12 0504   03/07/12 0700   ceFAZolin (ANCEF) IVPB 2 g/50 mL premix  Status:  Discontinued        2 g 100 mL/hr over 30 Minutes Intravenous 60 min pre-op 03/07/12 0700 03/07/12 1309           Patient was given sequential compression devices, early ambulation, and chemoprophylaxis to prevent DVT.  Patient benefited maximally from hospital stay and there were no complications.  Did require transfusion secondary to symptomatic acute blood loss anemia.  Recent vital signs: Patient Vitals for the past 24 hrs:  BP Temp Temp src Pulse Resp SpO2  03/11/12 0605 142/66 mmHg 98.1 F (36.7 C) Oral 58  16  97 %  03/10/12 2115 117/62 mmHg 98.6 F  (37 C) Oral 54  14  93 %  03/10/12 1750 166/64 mmHg 97.7 F (36.5 C) Oral 73  16  -  03/10/12 1648 163/68 mmHg 97.5 F (36.4 C) Oral 54  16  95 %  03/10/12 1645 163/68 mmHg 97.5 F (36.4 C) Oral 54  16  -  03/10/12 1550 182/64 mmHg 97 F (36.1 C) Oral 70  16  -  03/10/12 1450 141/61 mmHg 97.6 F (36.4 C) Oral 67  18  -  03/10/12 1415 128/66 mmHg 97.7 F (36.5 C) Oral 57  17  -  03/10/12 1305 130/63 mmHg 98.2 F (36.8 C) Oral 61  16  -  03/10/12 1230 168/67 mmHg 97.9 F (36.6 C) Oral 66  16  -  03/10/12 1130 130/57 mmHg 98 F (36.7 C) Oral 61  16  -  03/10/12 1030 134/62 mmHg 98 F (36.7 C) Oral 58  16  -  03/10/12 1005 124/50 mmHg 98.4 F (36.9 C) Oral 57  17  -  03/10/12 0732 144/66 mmHg - - 73  - -     Recent laboratory studies:  Basename 03/10/12 0405 03/10/2012 0420  WBC 8.0 8.1  HGB 8.0* 8.2*  HCT 23.6* 24.3*  PLT 185 180  NA -- --  K -- --  CL -- --  CO2 -- --  BUN -- --  CREATININE -- --  GLUCOSE -- --  INR -- --  CALCIUM -- --     Discharge Medications:   Medication List  As of 03/11/2012  6:35 AM   TAKE these medications         amiodarone 200 MG tablet   Commonly known as: PACERONE   Take 100 mg by mouth daily with breakfast. Patient takes one half tablet per day      enalapril 20 MG tablet   Commonly known as: VASOTEC   Take 20 mg by mouth daily with breakfast.      fish oil-omega-3 fatty acids 1000 MG capsule   Take 1 g by mouth daily with breakfast.      furosemide 40 MG tablet   Commonly known as: LASIX   Take 40 mg by mouth daily with breakfast.      methocarbamol 500 MG tablet   Commonly known as: ROBAXIN   Take 1 tablet (500 mg total) by mouth every 6 (six) hours as needed.      mulitivitamin with minerals Tabs   Take 1 tablet by mouth daily with breakfast.      naproxen sodium 550 MG tablet   Commonly known as: ANAPROX   Take 550 mg by mouth 2 (two) times daily with a meal.      oxyCODONE-acetaminophen 5-325 MG per tablet    Commonly known as: PERCOCET   Take 1-2 tablets by mouth every 4 (four) hours as needed for pain.      pantoprazole 40 MG tablet   Commonly known as: PROTONIX   Take 40 mg by mouth daily with breakfast.      rivaroxaban 10 MG Tabs tablet   Commonly known as: XARELTO   Take 1 tablet (10 mg total) by mouth daily before breakfast.      traMADol 50 MG tablet   Commonly known as: ULTRAM   Take 50 mg by mouth 4 (four) times daily.            Diagnostic Studies: Dg Hip Complete Right  03/07/2012  *RADIOLOGY REPORT*  Clinical Data: Right anterior total hip replacement  RIGHT HIP - COMPLETE 2+ VIEW  Comparison: None.  Findings: Two intraoperative views of the right hip show total right hip arthroplasty.  Surgical clips consistent with lymph node dissection project over the pelvis bilaterally.  No acute bony abnormality identified.  IMPRESSION: Changes of right hip arthroplasty.  No complicating feature identified.  Original Report Authenticated By: Britta Mccreedy, M.D.   Dg Pelvis Portable  03/07/2012  *RADIOLOGY REPORT*  Clinical Data: Status post right hip replacement.  PORTABLE PELVIS  Comparison: Intraoperative fluoroscopic spot views earlier this same date.  Findings: Right total hip replacement is identified.  The device is located.  No fracture is seen.  Femoral stem is incompletely visualized. Surgical staples and gas in the soft tissues noted.  IMPRESSION: Right total hip without evidence of complication.  Original Report Authenticated By: Bernadene Bell. D'ALESSIO, M.D.   Dg Hip Portable 1 View Right  03/07/2012  *RADIOLOGY REPORT*  Clinical Data: Hip replacement.  PORTABLE RIGHT HIP - 1 VIEW  Comparison: Intraoperative views of the right hip earlier this same date.  Findings: Right total hip arthroplasty is in place.  The device is located and no fracture is seen.  Gas in the soft tissues and surgical staples noted.  IMPRESSION: Right total hip replacement without evidence of complication.   Original Report Authenticated By: Bernadene Bell. D'ALESSIO, M.D.   Dg C-arm 1-60 Min-no Report  03/07/2012  CLINICAL DATA: hip film  C-ARM 1-60 MINUTES  Fluoroscopy was utilized by the requesting physician.  No radiographic  interpretation.      Disposition: to home  Discharge Orders    Future Orders Please Complete By Expires   Diet - low sodium heart healthy      Call MD / Call 911      Comments:   If you experience chest pain or shortness of breath, CALL 911 and be transported to the hospital emergency room.  If you develope a fever above 101 F, pus (white drainage) or increased drainage or redness at the wound, or calf pain, call your surgeon's office.   Constipation Prevention      Comments:   Drink plenty of fluids.  Prune juice may be helpful.  You may use a stool softener, such as Colace (over the counter) 100 mg twice a day.  Use MiraLax (over the counter) for constipation as needed.   Increase activity slowly as tolerated      Discharge instructions      Comments:   You can get your current dressing wet in the shower.  You can get your actual incision wet in 2 days. Dry dressing daily after that. Increase your activities as comfort allows. Follow-up at [facility] in 2 weeks.   Discharge patient            Signed: Kathryne Hitch 03/11/2012, 6:35 AM

## 2012-10-08 DIAGNOSIS — Z7901 Long term (current) use of anticoagulants: Secondary | ICD-10-CM

## 2012-10-08 HISTORY — DX: Long term (current) use of anticoagulants: Z79.01

## 2013-02-23 ENCOUNTER — Telehealth: Payer: Self-pay | Admitting: Cardiovascular Disease

## 2013-02-23 NOTE — Telephone Encounter (Signed)
Need refill on Metoprolol 50mg  #30-Call to [phone]!

## 2013-02-23 NOTE — Telephone Encounter (Signed)
Called and spoke with wife in reference to the refill request foe metoprolol. Informed her that at the ov the patient reported that he was not taking it. She's not sure. She will check with her husband and let us know whether this needs to be refilled or not.

## 2013-02-26 ENCOUNTER — Telehealth: Payer: Self-pay | Admitting: Cardiovascular Disease

## 2013-02-26 NOTE — Telephone Encounter (Signed)
Should pt be taking Amiodarone?Need to know whether to fill prescription or not!

## 2013-02-26 NOTE — Telephone Encounter (Signed)
Returned call.  Spoke w/ Onalee Hua, pharmacist.  Informed pt should be on amioderone 200mg  tabs taking 1/2 tab (100mg )daily.  Onalee Hua verbalized understanding.

## 2013-03-18 ENCOUNTER — Encounter: Payer: Self-pay | Admitting: Physician Assistant

## 2013-03-18 ENCOUNTER — Ambulatory Visit (INDEPENDENT_AMBULATORY_CARE_PROVIDER_SITE_OTHER): Payer: Medicare Other | Admitting: Physician Assistant

## 2013-03-18 VITALS — BP 180/80 | HR 58 | Ht 68.0 in | Wt 171.6 lb

## 2013-03-18 DIAGNOSIS — I10 Essential (primary) hypertension: Secondary | ICD-10-CM | POA: Insufficient documentation

## 2013-03-18 DIAGNOSIS — I712 Thoracic aortic aneurysm, without rupture: Secondary | ICD-10-CM

## 2013-03-18 DIAGNOSIS — I340 Nonrheumatic mitral (valve) insufficiency: Secondary | ICD-10-CM | POA: Insufficient documentation

## 2013-03-18 DIAGNOSIS — I359 Nonrheumatic aortic valve disorder, unspecified: Secondary | ICD-10-CM

## 2013-03-18 DIAGNOSIS — I351 Nonrheumatic aortic (valve) insufficiency: Secondary | ICD-10-CM | POA: Insufficient documentation

## 2013-03-18 DIAGNOSIS — I428 Other cardiomyopathies: Secondary | ICD-10-CM | POA: Insufficient documentation

## 2013-03-18 DIAGNOSIS — R5383 Other fatigue: Secondary | ICD-10-CM

## 2013-03-18 DIAGNOSIS — I499 Cardiac arrhythmia, unspecified: Secondary | ICD-10-CM

## 2013-03-18 DIAGNOSIS — R5381 Other malaise: Secondary | ICD-10-CM

## 2013-03-18 NOTE — Patient Instructions (Addendum)
Follow up at next scheduled appt or we will call you if we ned to see you sooner based on echocardiogram.

## 2013-03-18 NOTE — Progress Notes (Signed)
Date:  03/18/2013   ID:  Blake Stark, DOB 1927-05-25, MRN [id number]  PCP:  Pamelia Hoit, MD  Primary Cardiologist:  Allyson Sabal     History of Present Illness: Blake Stark is a 79 y.o. male  with a history of moderate to severe aortic insufficiency and mitral regurgitation as well as nonischemic cardiomyopathy with an ejection fraction of 25-35%. The last 2-D echocardiogram was April 2010 and also showed moderate to severe left atrial dilatation. His history also includes thoracic aortic aneurysm, atrial fibrillation, hypertension, and prostate cancer.  He is not on anticoagulation.  Patient was seen this morning at his primary care provider at [facility]. He was started on amoxicillin for acute sinusitis. He was asked to come to be seen here due to increased fatigue and not feeling well. Patient reports feeling weak since Thursday and having pressure in his head along with congestion and cough. He denies nausea, vomiting, dizziness, chest pain, shortness of breath, lower extremity edema.   Wt Readings from Last 3 Encounters:  03/18/13 171 lb 9.6 oz (77.837 kg)  03/07/12 154 lb (69.854 kg)  03/07/12 154 lb (69.854 kg)     Past Medical History  Diagnosis Date  . Cancer 03-05-12    Prostate cancer  . Hypertension   . Dysrhythmia 03-05-12    occ. skip beat, hx. RBBB  . Arthritis 03-05-12    severe arthritis right hip  . Blood transfusion 03-05-12    tx. ulcer-many yrs ago.    Current Outpatient Prescriptions  Medication Sig Dispense Refill  . amiodarone (PACERONE) 200 MG tablet Take 100 mg by mouth daily with breakfast. Patient takes one half tablet per day      . enalapril (VASOTEC) 20 MG tablet Take 20 mg by mouth daily with breakfast.      . fish oil-omega-3 fatty acids 1000 MG capsule Take 1 g by mouth daily with breakfast.      . furosemide (LASIX) 40 MG tablet Take 40 mg by mouth daily with breakfast.      . Multiple Vitamin (MULITIVITAMIN WITH MINERALS) TABS Take  1 tablet by mouth daily with breakfast.      . naproxen sodium (ANAPROX) 550 MG tablet Take 550 mg by mouth 2 (two) times daily with a meal.      . pantoprazole (PROTONIX) 40 MG tablet Take 40 mg by mouth daily with breakfast.      . rivaroxaban (XARELTO) 10 MG TABS tablet Take 1 tablet (10 mg total) by mouth daily before breakfast.  20 tablet  0  . traMADol (ULTRAM) 50 MG tablet Take 50 mg by mouth 4 (four) times daily.       No current facility-administered medications for this visit.    Allergies:    Allergies  Allergen Reactions  . [place] (Trospium)     Reaction=heart failure per patient??    Social History:  The patient  reports that he has never smoked. He does not have any smokeless tobacco history on file. He reports that he uses illicit drugs (LSD). He reports that he does not drink alcohol.   ROS:  Please see the history of present illness.  All other systems reviewed and negative.   PHYSICAL EXAM: VS:  BP 180/80  Pulse 58  Ht 5\' 8"  (1.727 m)  Wt 171 lb 9.6 oz (77.837 kg)  BMI 26.1 kg/m2 Well nourished, well developed, in no acute distress HEENT: Pupils are equal round react to light accommodation extraocular movements are  intact.  Neck: no JVDNo cervical lymphadenopathy. Cardiac: Irregular rate and rhythm with 2/6 systolic and diastolic murmurs. Lungs:  clear to auscultation bilaterally, no wheezing, rhonchi or rales Ext: no lower extremity edema.  2+ radial and dorsalis pedis pulses. Skin: warm and dry Neuro:  Grossly normal  EKG:  Atrial fibrillation. Rate 58 beats per minute.  Unchanged from prior  ASSESSMENT AND PLAN:  Problem List Items Addressed This Visit   Aortic insufficiency: moderate     Due the patient's symptoms of fatigue we will recheck 2-D echocardiogram and see if there's been progression of both his AI and MR.  Has been told in the past but is not a surgical candidate.  Maybe his symptoms are related to sinus congestion. He was started on  amoxicillin today by his primary care provider.    Cardiomyopathy, nonischemic, ejection fraction 25-35% by 2-D echocardiogram April 2010   HTN (hypertension)     BP is moderately elevated this morning however the patient states that when checked at [facility] it was 127/70. Will make no changes at this time. However there could be an increase in aortic insufficiency due to elevated pressure    Thoracic aortic aneurysm    Other Visit Diagnoses   Dysrhythmia    -  Primary    Relevant Orders       EKG 12-Lead       2D Echocardiogram without contrast    Fatigue        Relevant Orders       EKG 12-Lead

## 2013-03-18 NOTE — Assessment & Plan Note (Signed)
BP is moderately elevated this morning however the patient states that when checked at [facility] it was 127/70. Will make no changes at this time. However there could be an increase in aortic insufficiency due to elevated pressure

## 2013-03-18 NOTE — Assessment & Plan Note (Signed)
Due the patient's symptoms of fatigue we will recheck 2-D echocardiogram and see if there's been progression of both his AI and MR.  Has been told in the past but is not a surgical candidate.  Maybe his symptoms are related to sinus congestion. He was started on amoxicillin today by his primary care provider.

## 2013-03-23 ENCOUNTER — Ambulatory Visit (HOSPITAL_COMMUNITY)
Admission: RE | Admit: 2013-03-23 | Discharge: 2013-03-23 | Disposition: A | Discharge status: Home / Self Care | Payer: Medicare Other | Source: Ambulatory Visit | Attending: Cardiovascular Disease | Admitting: Cardiovascular Disease

## 2013-03-23 DIAGNOSIS — I499 Cardiac arrhythmia, unspecified: Secondary | ICD-10-CM

## 2013-03-23 DIAGNOSIS — I10 Essential (primary) hypertension: Secondary | ICD-10-CM | POA: Insufficient documentation

## 2013-03-23 DIAGNOSIS — I059 Rheumatic mitral valve disease, unspecified: Secondary | ICD-10-CM

## 2013-03-23 DIAGNOSIS — I509 Heart failure, unspecified: Secondary | ICD-10-CM | POA: Insufficient documentation

## 2013-03-23 DIAGNOSIS — I359 Nonrheumatic aortic valve disorder, unspecified: Secondary | ICD-10-CM | POA: Insufficient documentation

## 2013-03-23 DIAGNOSIS — I351 Nonrheumatic aortic (valve) insufficiency: Secondary | ICD-10-CM

## 2013-03-23 NOTE — Progress Notes (Signed)
2D Echo Performed 03/23/2013    Tabita Corbo, RCS  

## 2013-03-24 ENCOUNTER — Other Ambulatory Visit: Payer: Self-pay | Admitting: Cardiovascular Disease

## 2013-07-08 DIAGNOSIS — I2510 Atherosclerotic heart disease of native coronary artery without angina pectoris: Secondary | ICD-10-CM

## 2013-07-08 HISTORY — DX: Atherosclerotic heart disease of native coronary artery without angina pectoris: I25.10

## 2013-07-11 ENCOUNTER — Emergency Department (HOSPITAL_COMMUNITY): Payer: Medicare Other

## 2013-07-11 ENCOUNTER — Encounter (HOSPITAL_COMMUNITY): Payer: Self-pay | Admitting: *Deleted

## 2013-07-11 ENCOUNTER — Inpatient Hospital Stay (HOSPITAL_COMMUNITY)
Admission: EM | Admit: 2013-07-11 | Discharge: 2013-07-12 | DRG: 293 | Disposition: A | Discharge status: Home / Self Care | Payer: Medicare Other | Attending: Internal Medicine | Admitting: Internal Medicine

## 2013-07-11 DIAGNOSIS — I712 Thoracic aortic aneurysm, without rupture, unspecified: Secondary | ICD-10-CM | POA: Diagnosis present

## 2013-07-11 DIAGNOSIS — I428 Other cardiomyopathies: Secondary | ICD-10-CM

## 2013-07-11 DIAGNOSIS — I5023 Acute on chronic systolic (congestive) heart failure: Secondary | ICD-10-CM

## 2013-07-11 DIAGNOSIS — R079 Chest pain, unspecified: Secondary | ICD-10-CM

## 2013-07-11 DIAGNOSIS — Z8546 Personal history of malignant neoplasm of prostate: Secondary | ICD-10-CM

## 2013-07-11 DIAGNOSIS — I340 Nonrheumatic mitral (valve) insufficiency: Secondary | ICD-10-CM | POA: Diagnosis present

## 2013-07-11 DIAGNOSIS — I059 Rheumatic mitral valve disease, unspecified: Secondary | ICD-10-CM

## 2013-07-11 DIAGNOSIS — I359 Nonrheumatic aortic valve disorder, unspecified: Secondary | ICD-10-CM

## 2013-07-11 DIAGNOSIS — R748 Abnormal levels of other serum enzymes: Secondary | ICD-10-CM | POA: Diagnosis present

## 2013-07-11 DIAGNOSIS — I351 Nonrheumatic aortic (valve) insufficiency: Secondary | ICD-10-CM | POA: Diagnosis present

## 2013-07-11 DIAGNOSIS — I10 Essential (primary) hypertension: Secondary | ICD-10-CM | POA: Diagnosis present

## 2013-07-11 DIAGNOSIS — I080 Rheumatic disorders of both mitral and aortic valves: Secondary | ICD-10-CM | POA: Diagnosis present

## 2013-07-11 DIAGNOSIS — I4891 Unspecified atrial fibrillation: Secondary | ICD-10-CM | POA: Diagnosis present

## 2013-07-11 DIAGNOSIS — I509 Heart failure, unspecified: Secondary | ICD-10-CM

## 2013-07-11 DIAGNOSIS — Z96649 Presence of unspecified artificial hip joint: Secondary | ICD-10-CM

## 2013-07-11 DIAGNOSIS — Z923 Personal history of irradiation: Secondary | ICD-10-CM

## 2013-07-11 DIAGNOSIS — M1610 Unilateral primary osteoarthritis, unspecified hip: Secondary | ICD-10-CM | POA: Diagnosis present

## 2013-07-11 HISTORY — DX: Heart failure, unspecified: I50.9

## 2013-07-11 LAB — COMPREHENSIVE METABOLIC PANEL
ALT: 16 U/L (ref 0–53)
Albumin: 4 g/dL (ref 3.5–5.2)
Alkaline Phosphatase: 71 U/L (ref 39–117)
Chloride: 102 mEq/L (ref 96–112)
GFR calc Af Amer: 73 mL/min — ABNORMAL LOW (ref >90)
Glucose, Bld: 97 mg/dL (ref 70–99)
Potassium: 4.8 mEq/L (ref 3.5–5.1)
Sodium: 136 mEq/L (ref 135–145)
Total Bilirubin: 0.9 mg/dL (ref 0.3–1.2)
Total Protein: 7.3 g/dL (ref 6.0–8.3)

## 2013-07-11 LAB — CBC WITH DIFFERENTIAL/PLATELET
Eosinophils Absolute: 0 10*3/uL (ref 0.0–0.7)
Hemoglobin: 12.1 g/dL — ABNORMAL LOW (ref 13.0–17.0)
Lymphs Abs: 1.2 10*3/uL (ref 0.7–4.0)
MCH: 32.6 pg (ref 26.0–34.0)
Neutro Abs: 5 10*3/uL (ref 1.7–7.7)
Neutrophils Relative %: 67 % (ref 43–77)
Platelets: 244 10*3/uL (ref 150–400)
RBC: 3.71 MIL/uL — ABNORMAL LOW (ref 4.22–5.81)
WBC: 7.4 10*3/uL (ref 4.0–10.5)

## 2013-07-11 LAB — PRO B NATRIURETIC PEPTIDE: Pro B Natriuretic peptide (BNP): 16437 pg/mL — ABNORMAL HIGH (ref 0–450)

## 2013-07-11 MED ORDER — ONDANSETRON HCL 4 MG/2ML IJ SOLN
4.0000 mg | Freq: Once | INTRAMUSCULAR | Status: AC
  Administered 2013-07-11: 4 mg via INTRAVENOUS
  Filled 2013-07-11: qty 2

## 2013-07-11 MED ORDER — FUROSEMIDE 10 MG/ML IJ SOLN
40.0000 mg | Freq: Once | INTRAMUSCULAR | Status: AC
  Administered 2013-07-11: 40 mg via INTRAVENOUS
  Filled 2013-07-11: qty 4

## 2013-07-11 MED ORDER — METOCLOPRAMIDE HCL 5 MG/ML IJ SOLN
10.0000 mg | Freq: Once | INTRAMUSCULAR | Status: AC
  Administered 2013-07-11: 10 mg via INTRAVENOUS
  Filled 2013-07-11: qty 2

## 2013-07-11 MED ORDER — NITROGLYCERIN 2 % TD OINT
1.0000 [in_us] | TOPICAL_OINTMENT | Freq: Once | TRANSDERMAL | Status: AC
  Administered 2013-07-11: 1 [in_us] via TOPICAL
  Filled 2013-07-11: qty 1

## 2013-07-11 MED ORDER — NITROGLYCERIN IN D5W 200-5 MCG/ML-% IV SOLN
10.0000 ug/min | INTRAVENOUS | Status: DC
  Filled 2013-07-11: qty 250

## 2013-07-11 MED ORDER — FENTANYL CITRATE 0.05 MG/ML IJ SOLN
100.0000 ug | Freq: Once | INTRAMUSCULAR | Status: AC
  Administered 2013-07-11: 100 ug via INTRAVENOUS
  Filled 2013-07-11: qty 2

## 2013-07-11 MED ORDER — FUROSEMIDE 10 MG/ML IJ SOLN
40.0000 mg | Freq: Every day | INTRAMUSCULAR | Status: DC
  Administered 2013-07-12: 40 mg via INTRAVENOUS
  Filled 2013-07-11: qty 4

## 2013-07-11 MED ORDER — NITROGLYCERIN 0.4 MG SL SUBL: 0.4000 mg | SUBLINGUAL_TABLET | SUBLINGUAL | Status: DC | PRN

## 2013-07-11 NOTE — ED Notes (Signed)
Attempted report x1. 

## 2013-07-11 NOTE — ED Notes (Signed)
pts HR decreased to 45 and Dr. Conley Rolls made aware. Pt denies CP, N/V/D and denies light headedness.

## 2013-07-11 NOTE — ED Notes (Signed)
Pt states that he has been having chest pain since last week that got better with taking pain pill.  Pt has history of heart failure and known aneurysm.  Pt states that today both of his arms and hands started hurting bag.  Pt reports sob

## 2013-07-11 NOTE — H&P (Signed)
[facility] History and Physical  Blake Stark ZOX:[id number] DOB: 1927-05-04    PCP:   Pamelia Hoit, MD   Chief Complaint: chest tightness and increase shortness of breath.  HPI: Blake Stark is an 79 y.o. male with hx of moderate AI and moderate MR, hx of non-ischemic CMP, HTN, dysrhythmia, with hx of CHF, prior cath many years ago EF 15-20 percent, recent ECHO 45-55 percent (could be false due to moderate valvular regurgitation?), presents to the ER with substernal chest tightness intermittently for the past 2 days. He had some nausea, no vomiting one day last week, and he stopped taking his Lasix.  He noted orthopnea and bilateral pedal swelling.  He has no coughs, fever, chills, nausea or vomiting at this time, and he is painfree after NTP given to him.  Evalatuion in the ER included an EKG which showed NSR and no acute ST-T changes.  His CXR showed interstitial edema, no pulmonary infiltrate, and his troponin was slightly elevated at 0.12.  Hospitalist was asked to admit him for CHF and CP.  Rewiew of Systems:  Constitutional: Negative for malaise, fever and chills. No significant weight loss or weight gain Eyes: Negative for eye pain, redness and discharge, diplopia, visual changes, or flashes of light. ENMT: Negative for ear pain, hoarseness, nasal congestion, sinus pressure and sore throat. No headaches; tinnitus, drooling, or problem swallowing. Cardiovascular: Negative for palpitations, diaphoresis, dyspnea and peripheral edema. ;  Respiratory: Negative for cough, hemoptysis, wheezing and stridor. No pleuritic chestpain. Gastrointestinal: Negative for nausea, vomiting, diarrhea, constipation, abdominal pain, melena, blood in stool, hematemesis, jaundice and rectal bleeding.    Genitourinary: Negative for frequency, dysuria, incontinence,flank pain and hematuria; Musculoskeletal: Negative for back pain and neck pain. Negative for swelling and trauma.;  Skin: .  Negative for pruritus, rash, abrasions, bruising and skin lesion.; ulcerations Neuro: Negative for headache, lightheadedness and neck stiffness. Negative for weakness, altered level of consciousness , altered mental status, extremity weakness, burning feet, involuntary movement, seizure and syncope.  Psych: negative for anxiety, depression, insomnia, tearfulness, panic attacks, hallucinations, paranoia, suicidal or homicidal ideation   Past Medical History  Diagnosis Date  . Cancer 03-05-12    Prostate cancer  . Hypertension   . Dysrhythmia 03-05-12    occ. skip beat, hx. RBBB  . Arthritis 03-05-12    severe arthritis right hip  . Blood transfusion 03-05-12    tx. ulcer-many yrs ago.  . CHF (congestive heart failure)     Past Surgical History  Procedure Laterality Date  . Prostate surgery  03-05-12    surgery/ with radiation  later after PSA Increased  . Appendectomy  03-05-12    '62  . Hernia repair  03-05-12    RIH  . Knee arthroscopy  03-05-12    Bilateral  . Total hip arthroplasty  03/07/2012    Procedure: TOTAL HIP ARTHROPLASTY ANTERIOR APPROACH;  Surgeon: Kathryne Hitch, MD;  Location: [facility];  Service: Orthopedics;  Laterality: Right;  Right Total Hip Arthroplasty, Direct Anterior    Medications:  HOME MEDS: Prior to Admission medications   Medication Sig Start Date End Date Taking? Authorizing Provider  ALPRAZolam Prudy Feeler) 0.5 MG tablet Take 0.5 mg by mouth at bedtime.  06/29/13  Yes Historical Provider, MD  amiodarone (PACERONE) 200 MG tablet Take 100 mg by mouth daily.   Yes Historical Provider, MD  enalapril (VASOTEC) 20 MG tablet Take 20 mg by mouth daily with breakfast.   Yes Historical Provider, MD  fish oil-omega-3 fatty acids 1000 MG capsule Take 1 g by mouth daily with breakfast.   Yes Historical Provider, MD  furosemide (LASIX) 40 MG tablet Take 40 mg by mouth daily with breakfast.   Yes Historical Provider, MD  Multiple Vitamin (MULITIVITAMIN WITH MINERALS)  TABS Take 1 tablet by mouth daily with breakfast.   Yes Historical Provider, MD  naproxen sodium (ANAPROX) 550 MG tablet Take 550 mg by mouth 2 (two) times daily as needed (for pain).    Yes Historical Provider, MD     Allergies:  Allergies  Allergen Reactions  . Sanctura [Trospium]     Reaction=heart failure per patient??    Social History:   reports that he has never smoked. He does not have any smokeless tobacco history on file. He reports that he does not drink alcohol or use illicit drugs.  Family History: No family history on file.   Physical Exam: Filed Vitals:   07/11/13 2115 07/11/13 2130 07/11/13 2145 07/11/13 2220  BP: 146/55 138/49 163/77 138/48  Pulse: 47 45 59   Temp:      TempSrc:      Resp:    24  Weight:      SpO2: 97% 96% 99% 97%   Blood pressure 138/48, pulse 59, temperature 97.5 F (36.4 C), temperature source Oral, resp. rate 24, weight 80.939 kg (178 lb 7 oz), SpO2 97.00%.  GEN:  Pleasant  patient lying in the stretcher in no acute distress; cooperative with exam. PSYCH:  alert and oriented x4; does not appear anxious or depressed; affect is appropriate. HEENT: Mucous membranes pink and anicteric; PERRLA; EOM intact; no cervical lymphadenopathy nor thyromegaly or carotid bruit; no JVD; There were no stridor. Neck is very supple. Breasts:: Not examined CHEST WALL: No tenderness CHEST: Normal respiration, clear to auscultation bilaterally.  HEART: Regular rate and rhythm.  There are SEM 2/6 LSB, diastolic rumble 2/6 LSB to the apex. BACK: No kyphosis or scoliosis; no CVA tenderness ABDOMEN: soft and non-tender; no masses, no organomegaly, normal abdominal bowel sounds; no pannus; no intertriginous candida. There is no rebound and no distention. Rectal Exam: Not done EXTREMITIES: No bone or joint deformity; age-appropriate arthropathy of the hands and knees;  no ulcerations.  There is no calf tenderness. He has 2+ edema. Genitalia: not examined PULSES:  2+ and symmetric SKIN: Normal hydration no rash or ulceration CNS: Cranial nerves 2-12 grossly intact no focal lateralizing neurologic deficit.  Speech is fluent; uvula elevated with phonation, facial symmetry and tongue midline. DTR are normal bilaterally, cerebella exam is intact, barbinski is negative and strengths are equaled bilaterally.  No sensory loss.   Labs on Admission:  Basic Metabolic Panel:  Recent Labs Lab 07/11/13 1830  NA 136  K 4.8  CL 102  CO2 21  GLUCOSE 97  BUN 19  CREATININE 1.05  CALCIUM 8.7   Liver Function Tests:  Recent Labs Lab 07/11/13 1830  AST 27  ALT 16  ALKPHOS 71  BILITOT 0.9  PROT 7.3  ALBUMIN 4.0   No results found for this basename: LIPASE, AMYLASE,  in the last 168 hours No results found for this basename: AMMONIA,  in the last 168 hours CBC:  Recent Labs Lab 07/11/13 1830  WBC 7.4  NEUTROABS 5.0  HGB 12.1*  HCT 34.9*  MCV 94.1  PLT 244   Cardiac Enzymes: No results found for this basename: CKTOTAL, CKMB, CKMBINDEX, TROPONINI,  in the last 168 hours  CBG: No results found  for this basename: GLUCAP,  in the last 168 hours   Radiological Exams on Admission: Dg Chest 2 View  07/11/2013   CLINICAL DATA:  Chest pain.  EXAM: CHEST - 2 VIEW  COMPARISON:  05/25/2013 chest x-ray at [facility]  FINDINGS: Stable significant cardiac enlargement. There is increasing pulmonary vascularity suggestive of interstitial edema compared to the prior chest x-ray. There are small bilateral pleural effusions. No focal airspace consolidation is identified. No nodules are detected. The bony thorax is unremarkable.  IMPRESSION: Interstitial edema with small bilateral pleural effusions.   Electronically Signed   By: Irish Lack M.D.   On: 07/11/2013 20:25    EKG: Independently reviewed. No acute ST-T changes.   Assessment/Plan Present on Admission:  . Chest pain at rest . Aortic insufficiency: moderate . Cardiomyopathy,  nonischemic, ejection fraction 25-35% by 2-D echocardiogram April 2010 . HTN (hypertension) . Mitral regurgitation . Thoracic aortic aneurysm  PLAN:  Will admit for chest pain r/out.  Currently pain free.  It is possible that this is cardiac.  He is currently pain free and is comfortable.  With respect to his CHF, I suspect he was volume overloaded because he stopped taking his Lasix over a week ago, rather than his valvular disease getting worse.  I am concerned that the last EF in June 14, with moderate MR, that the EF may not be accurate.  I will diurese him a little more, continue his NTP for now, and resume all his cardiac meds.  He is stable, full code, and will be admitted to [facility] service.  Please consult [facility] tomorrow for further recommendation.   Thank you for asking me to participate in the care of this nice patient.  Other plans as per orders.  Code Status: FULL Unk Lightning, MD. [facility] Pager [phone] 7pm to 7am.  07/11/2013, 10:37 PM

## 2013-07-11 NOTE — ED Notes (Signed)
Pt became nauseated, started sweating after pain medication. EDP made aware. Provided cold washcloth and fan.

## 2013-07-11 NOTE — ED Provider Notes (Signed)
CSN: [id number]     Arrival date & time 07/11/13  1755 History   First MD Initiated Contact with Patient 07/11/13 1804     Chief Complaint  Patient presents with  . Chest Pain   (Consider location/radiation/quality/duration/timing/severity/associated sxs/prior Treatment) HPI This 79 year old male states for the last week 24 hours a day he is a constant chest pressure sensation worse if he lies down worse if he walks around but always present and never resolved he is also more short of breath and usual, at baseline he is mildly short of breath walking up stairs but usually is not short of breath walking on a flat surface, over the last week however his gradually become progressively more short of breath with less activity and now is short of breath just trying to walk across a room which is unusual for him, his legs are also more swollen than usual, he has not weighed himself, he has had no fever cough, he is no altered mental status or focal or lateralizing weakness or numbness, his chest pain is a constant pressure 24 hours a day for the last week, there is no treatment prior to arrival, he has a history of heart failure with ejection fraction in the past of 20% most recent echocardiogram ejection fraction of 40-50%, he has moderate aortic and mitral regurgitation and a thoracic aortic aneurysm but has been told he is not a surgical candidate, his chest pain is moderately severe 24 hours a day for the last week. Past Medical History  Diagnosis Date  . Cancer 03-05-12    Prostate cancer  . Hypertension   . Dysrhythmia 03-05-12    occ. skip beat, hx. RBBB  . Arthritis 03-05-12    severe arthritis right hip  . Blood transfusion 03-05-12    tx. ulcer-many yrs ago.  . CHF (congestive heart failure)    Past Surgical History  Procedure Laterality Date  . Prostate surgery  03-05-12    surgery/ with radiation  later after PSA Increased  . Appendectomy  03-05-12    '62  . Hernia repair  03-05-12    RIH   . Knee arthroscopy  03-05-12    Bilateral  . Total hip arthroplasty  03/07/2012    Procedure: TOTAL HIP ARTHROPLASTY ANTERIOR APPROACH;  Surgeon: Kathryne Hitch, MD;  Location: [facility];  Service: Orthopedics;  Laterality: Right;  Right Total Hip Arthroplasty, Direct Anterior   History reviewed. No pertinent family history. History  Substance Use Topics  . Smoking status: Never Smoker   . Smokeless tobacco: Not on file  . Alcohol Use: No    Review of Systems 10 Systems reviewed and are negative for acute change except as noted in the HPI. Allergies  Sanctura  Home Medications   No current outpatient prescriptions on file. BP 150/68  Pulse 47  Temp(Src) 98.3 F (36.8 C) (Oral)  Resp 20  Ht 5\' 10"  (1.778 m)  Wt 171 lb 1.6 oz (77.61 kg)  BMI 24.55 kg/m2  SpO2 95% Physical Exam  Nursing note and vitals reviewed. Constitutional:  Awake, alert, nontoxic appearance.  HENT:  Head: Atraumatic.  Eyes: Right eye exhibits no discharge. Left eye exhibits no discharge.  Neck: Neck supple.  Cardiovascular: Normal rate and regular rhythm.   Murmur heard. Systolic and diastolic murmur at right upper sternal border with systolic murmur at apex  Pulmonary/Chest: He is in respiratory distress. He has no wheezes. He has rales. He exhibits no tenderness.  Pulse oximetry normal on  room air 100% however patient has bibasilar crackles with tachypnea speak sentences with mild respiratory distress at rest  Abdominal: Soft. There is no tenderness. There is no rebound.  Musculoskeletal: He exhibits edema. He exhibits no tenderness.  Baseline ROM, no obvious new focal weakness. Mild edema of both lower legs  Neurological:  Mental status and motor strength appears baseline for patient and situation.  Skin: No rash noted.  Psychiatric: He has a normal mood and affect.    ED Course  Procedures (including critical care time) ECG: Atrial fibrillation, ventricular rate 72, normal axis, right  bundle branch block, diffuse T wave abnormality, compared to June 2013 T wave abnormality now present   Pt feels improved after observation and/or treatment in ED but not enough for discharge.Patient / Family / Caregiver informed of clinical course, understand medical decision-making process, and agree with plan. [facility] recs Unassigned (paged) med admit. 2110 [facility] to see Pt in ED for admit as Unassigned Med. 2120 Labs Review Labs Reviewed  MRSA PCR SCREENING - Abnormal; Notable for the following:    MRSA by PCR POSITIVE (*)    All other components within normal limits  PRO B NATRIURETIC PEPTIDE - Abnormal; Notable for the following:    Pro B Natriuretic peptide (BNP) 16437.0 (*)    All other components within normal limits  CBC WITH DIFFERENTIAL - Abnormal; Notable for the following:    RBC 3.71 (*)    Hemoglobin 12.1 (*)    HCT 34.9 (*)    Monocytes Relative 17 (*)    Monocytes Absolute 1.2 (*)    All other components within normal limits  COMPREHENSIVE METABOLIC PANEL - Abnormal; Notable for the following:    GFR calc non Af Amer 63 (*)    GFR calc Af Amer 73 (*)    All other components within normal limits  POCT I-STAT TROPONIN I - Abnormal; Notable for the following:    Troponin i, poc 0.12 (*)    All other components within normal limits  TROPONIN I  TROPONIN I  TROPONIN I  TSH  PRO B NATRIURETIC PEPTIDE   Imaging Review Dg Chest 2 View  07/11/2013   CLINICAL DATA:  Chest pain.  EXAM: CHEST - 2 VIEW  COMPARISON:  05/25/2013 chest x-ray at [facility] [place]  FINDINGS: Stable significant cardiac enlargement. There is increasing pulmonary vascularity suggestive of interstitial edema compared to the prior chest x-ray. There are small bilateral pleural effusions. No focal airspace consolidation is identified. No nodules are detected. The bony thorax is unremarkable.  IMPRESSION: Interstitial edema with small bilateral pleural effusions.   Electronically  Signed   By: Irish Lack M.D.   On: 07/11/2013 20:25    MDM   1. Heart failure   2. Acute on chronic systolic heart failure   3. Aortic insufficiency   4. Cardiomyopathy, nonischemic   5. HTN (hypertension)   6. Thoracic aortic aneurysm    The patient appears reasonably stabilized for admission considering the current resources, flow, and capabilities available in the ED at this time, and I doubt any other [facility] requiring further screening and/or treatment in the ED prior to admission.    Hurman Horn, MD 07/12/13 [phone]

## 2013-07-11 NOTE — ED Notes (Signed)
Dr Le at bedside.  

## 2013-07-12 ENCOUNTER — Encounter (HOSPITAL_COMMUNITY): Payer: Self-pay | Admitting: *Deleted

## 2013-07-12 DIAGNOSIS — I5023 Acute on chronic systolic (congestive) heart failure: Principal | ICD-10-CM

## 2013-07-12 DIAGNOSIS — R079 Chest pain, unspecified: Secondary | ICD-10-CM

## 2013-07-12 DIAGNOSIS — I712 Thoracic aortic aneurysm, without rupture: Secondary | ICD-10-CM

## 2013-07-12 DIAGNOSIS — I428 Other cardiomyopathies: Secondary | ICD-10-CM

## 2013-07-12 DIAGNOSIS — I359 Nonrheumatic aortic valve disorder, unspecified: Secondary | ICD-10-CM

## 2013-07-12 DIAGNOSIS — I10 Essential (primary) hypertension: Secondary | ICD-10-CM

## 2013-07-12 LAB — TSH: TSH: 5.377 u[IU]/mL — ABNORMAL HIGH (ref 0.350–4.500)

## 2013-07-12 MED ORDER — ENALAPRIL MALEATE 20 MG PO TABS
20.0000 mg | ORAL_TABLET | Freq: Every day | ORAL | Status: DC
  Administered 2013-07-12: 20 mg via ORAL
  Filled 2013-07-12 (×3): qty 1

## 2013-07-12 MED ORDER — ZOLPIDEM TARTRATE 5 MG PO TABS: 5.0000 mg | ORAL_TABLET | Freq: Every evening | ORAL | Status: DC | PRN

## 2013-07-12 MED ORDER — ALPRAZOLAM 0.5 MG PO TABS
0.5000 mg | ORAL_TABLET | Freq: Every day | ORAL | Status: DC
  Administered 2013-07-12: 0.5 mg via ORAL
  Filled 2013-07-12: qty 1

## 2013-07-12 MED ORDER — OMEGA-3 FATTY ACIDS 1000 MG PO CAPS: 1.0000 g | ORAL_CAPSULE | Freq: Every day | ORAL | Status: DC

## 2013-07-12 MED ORDER — HEPARIN SODIUM (PORCINE) 5000 UNIT/ML IJ SOLN
5000.0000 [IU] | Freq: Three times a day (TID) | INTRAMUSCULAR | Status: DC
  Administered 2013-07-12 (×2): 5000 [IU] via SUBCUTANEOUS
  Filled 2013-07-12 (×6): qty 1

## 2013-07-12 MED ORDER — OMEGA-3-ACID ETHYL ESTERS 1 G PO CAPS
1.0000 g | ORAL_CAPSULE | Freq: Every day | ORAL | Status: DC
  Administered 2013-07-12: 1 g via ORAL
  Filled 2013-07-12 (×3): qty 1

## 2013-07-12 MED ORDER — SODIUM CHLORIDE 0.9 % IJ SOLN
3.0000 mL | Freq: Two times a day (BID) | INTRAMUSCULAR | Status: DC
  Administered 2013-07-12 (×2): 3 mL via INTRAVENOUS

## 2013-07-12 MED ORDER — ONDANSETRON HCL 4 MG PO TABS: 4.0000 mg | ORAL_TABLET | Freq: Four times a day (QID) | ORAL | Status: DC | PRN

## 2013-07-12 MED ORDER — ASPIRIN EC 325 MG PO TBEC
325.0000 mg | DELAYED_RELEASE_TABLET | Freq: Every day | ORAL | Status: DC
  Administered 2013-07-12: 10:00:00 325 mg via ORAL
  Filled 2013-07-12: qty 1

## 2013-07-12 MED ORDER — MORPHINE SULFATE 2 MG/ML IJ SOLN: 2.0000 mg | INTRAMUSCULAR | Status: DC | PRN

## 2013-07-12 MED ORDER — MUPIROCIN 2 % EX OINT
1.0000 "application " | TOPICAL_OINTMENT | Freq: Two times a day (BID) | CUTANEOUS | Status: DC
  Administered 2013-07-12: 1 via NASAL
  Filled 2013-07-12: qty 22

## 2013-07-12 MED ORDER — DOCUSATE SODIUM 100 MG PO CAPS
100.0000 mg | ORAL_CAPSULE | Freq: Two times a day (BID) | ORAL | Status: DC
  Administered 2013-07-12: 100 mg via ORAL
  Filled 2013-07-12 (×2): qty 1

## 2013-07-12 MED ORDER — ONDANSETRON HCL 4 MG/2ML IJ SOLN: 4.0000 mg | Freq: Four times a day (QID) | INTRAMUSCULAR | Status: DC | PRN

## 2013-07-12 MED ORDER — FUROSEMIDE 40 MG PO TABS: 40.0000 mg | ORAL_TABLET | Freq: Two times a day (BID) | ORAL | Status: DC

## 2013-07-12 MED ORDER — CHLORHEXIDINE GLUCONATE CLOTH 2 % EX PADS
6.0000 | MEDICATED_PAD | Freq: Every day | CUTANEOUS | Status: DC
  Administered 2013-07-12: 6 via TOPICAL

## 2013-07-12 MED ORDER — AMIODARONE HCL 100 MG PO TABS
100.0000 mg | ORAL_TABLET | Freq: Every day | ORAL | Status: DC
  Administered 2013-07-12: 100 mg via ORAL
  Filled 2013-07-12: qty 1

## 2013-07-12 NOTE — Consult Note (Signed)
Pt. Seen and examined. Agree with the NP/PA-C note as written.  Blake Stark likely had a heart failure exacerbation and also had chest pain. He had a mildly elevated troponin POC, then negative, which could be from heart failure. He has not had an ischemic evaluation in a while. He also has an aortic aneurysm which he does not want intervened on. I have recommended that he continue to be diuresed and have a stress test, which he refuses. He wants to go home today to take care of his wife. I spent 15 minutes discussing my concerns with him, but he continues to refuse any further treatment of work-up.   I would recommend discharging home on lasix 40 mg BID.  He will need a new Rx for this. Our office will call him for a follow-up appointment on Monday.  Thanks for consulting [place].  Chrystie Nose, MD, [facility] Attending Cardiologist [facility]

## 2013-07-12 NOTE — Progress Notes (Addendum)
Patient arrived to the unit from the ED via stretcher. Patient alert and oriented with stand-by assist.  Admission weight, vitals and assessment completed. Patient A-Fib on the monitor with a slow ventricular response (verified by EKG-low of 39-50's), Blake Stark notified.  Fall and safety plan reviewed with patient,.  Call light within reach, will continue to monitor. Blood pressure 157/76, pulse 65, temperature 97.5 F (36.4 C), temperature source Oral, resp. rate 22, height 5\' 10"  (1.778 m), weight 77.61 kg (171 lb 1.6 oz), SpO2 98.00%. Blake Stark

## 2013-07-12 NOTE — Discharge Summary (Signed)
Physician Discharge Summary  Blake Stark:[id number] DOB: 11/09/1926 DOA: 07/11/2013  PCP: Pamelia Hoit, MD  Admit date: 07/11/2013 Discharge date: 07/12/2013  Time spent: 25 minutes  Recommendations for Outpatient Follow-up:  1. Patient is leaving against the wishes of both myself as well as his cardiologist. He is adamant about going home to care for his wife. 2. His Lasix has been changed to 40 mg twice a day 3. He'll followup with his PCP in one month's time. 4. He will call [facility] cardiology tomorrow and set up an appointment to follow up with them this week.  Discharge Diagnoses:  Principal Problem:   Acute on chronic systolic heart failure Active Problems:   Aortic insufficiency: moderate   Mitral regurgitation   HTN (hypertension)   Cardiomyopathy, nonischemic, ejection fraction 25-35% by 2-D echocardiogram April 2010   Thoracic aortic aneurysm   Chest pain at rest   Discharge Condition: Mild improvement from initial presentation  Diet recommendation: Heart healthy  Filed Weights   07/11/13 1802 07/12/13 0005  Weight: 80.939 kg (178 lb 7 oz) 77.61 kg (171 lb 1.6 oz)    History of present illness:  On 10/4 night:Blake Stark is an 79 y.o. male with hx of moderate AI and moderate MR, hx of non-ischemic CMP, HTN, dysrhythmia, with hx of CHF, prior cath many years ago EF 15-20 percent, recent ECHO 45-55 percent (could be false due to moderate valvular regurgitation?), presents to the ER with substernal chest tightness intermittently for the past 2 days. He had some nausea, no vomiting one day last week, and he stopped taking his Lasix. He noted orthopnea and bilateral pedal swelling. He has no coughs, fever, chills, nausea or vomiting at this time, and he is painfree after NTP given to him. Evalatuion in the ER included an EKG which showed NSR and no acute ST-T changes. His CXR showed interstitial edema, no pulmonary infiltrate, and his troponin was  slightly elevated at 0.12. Hospitalist was asked to admit him for CHF and CP.   Hospital Course:  Principal Problem:   Acute on chronic systolic heart failure: Patient received IV Lasix and diuresed approximately 800 mL. He was seen by cardiology in the morning and was adamant about being discharged. He stated that he was feeling better and needed to get home to his wife. It was explained to him by both the cardiologist as well as myself and also in the presence of his daughter that this is quite dangerous and he can come back in worse condition. He states he understands this but states that his wife is a healing and he needs to get home. Were unable to convince him to stay. As a compromise, we have talked to him about increasing his Lasix to 40 twice a day, following up with [facility] cardiology in the office this week and also given her a packet of information on CHF. I discussed with the patient personally about weighing himself daily. Active Problems:   Aortic insufficiency: moderate   Mitral regurgitation   HTN (hypertension)   Cardiomyopathy, nonischemic, ejection fraction 25-35% by 2-D echocardiogram April 2010   Thoracic aortic aneurysm: Patient states he does not want anything done   Chest pain at rest: Patient declined a Lexi scan.  Mildly elevated troponin was felt to be more likely due to CHF Atrial fibrillation: Paroxysmal. By the time I examined him, he was back to normal sinus rhythm. He did not want any further workup for this.  Procedures:  None  Consultations:  [facility] cardiology  Discharge Exam: Filed Vitals:   07/12/13 1022  BP: 150/68  Pulse: 47  Temp:   Resp:     General: Alert and oriented x3, no acute distress Cardiovascular: Regular rate and rhythm, S1-S2, soft 2/6 systolic ejection murmur Respiratory: Clear to auscultation bilaterally  Discharge Instructions  Discharge Orders   Future Orders Complete By Expires   Diet - low sodium heart  healthy  As directed    Increase activity slowly  As directed        Medication List         ALPRAZolam 0.5 MG tablet  Commonly known as:  XANAX  Take 0.5 mg by mouth at bedtime.     amiodarone 200 MG tablet  Commonly known as:  PACERONE  Take 100 mg by mouth daily.     enalapril 20 MG tablet  Commonly known as:  VASOTEC  Take 20 mg by mouth daily with breakfast.     fish oil-omega-3 fatty acids 1000 MG capsule  Take 1 g by mouth daily with breakfast.     furosemide 40 MG tablet  Commonly known as:  LASIX  Take 1 tablet (40 mg total) by mouth 2 (two) times daily.     multivitamin with minerals Tabs tablet  Take 1 tablet by mouth daily with breakfast.     naproxen sodium 550 MG tablet  Commonly known as:  ANAPROX  Take 550 mg by mouth 2 (two) times daily as needed (for pain).       Allergies  Allergen Reactions  . Sanctura [Trospium]     Reaction=heart failure per patient??       Follow-up Information   Follow up with Pamelia Hoit, MD In 1 month.   Specialty:  Family Medicine   Contact information:   [address] [place] [address] [phone]       Follow up with Runell Gess, MD. Call in 1 week.   Specialty:  Cardiology   Contact information:   [address] [place] [address] [phone]        The results of significant diagnostics from this hospitalization (including imaging, microbiology, ancillary and laboratory) are listed below for reference.    Significant Diagnostic Studies: Dg Chest 2 View  07/11/2013   CLINICAL DATA:  Chest pain.  EXAM: CHEST - 2 VIEW  COMPARISON:  05/25/2013 chest x-ray at [facility] [place]  FINDINGS: Stable significant cardiac enlargement. There is increasing pulmonary vascularity suggestive of interstitial edema compared to the prior chest x-ray. There are small bilateral pleural effusions. No focal airspace consolidation is identified. No nodules are detected. The bony  thorax is unremarkable.  IMPRESSION: Interstitial edema with small bilateral pleural effusions.   Electronically Signed   By: Irish Lack M.D.   On: 07/11/2013 20:25    Microbiology: Recent Results (from the past 240 hour(s))  MRSA PCR SCREENING     Status: Abnormal   Collection Time    07/12/13 12:01 AM      Result Value Range Status   MRSA by PCR POSITIVE (*) NEGATIVE Final   Comment:            The GeneXpert MRSA Assay (FDA     approved for NASAL specimens     only), is one component of a     comprehensive MRSA colonization     surveillance program. It is not     intended to diagnose MRSA  infection nor to guide or     monitor treatment for     MRSA infections.     RESULT CALLED TO, READ BACK BY AND VERIFIED WITH:     Juanell Fairly RN [phone] GREEN R     Labs: Basic Metabolic Panel:  Recent Labs Lab 07/11/13 1830  NA 136  K 4.8  CL 102  CO2 21  GLUCOSE 97  BUN 19  CREATININE 1.05  CALCIUM 8.7   Liver Function Tests:  Recent Labs Lab 07/11/13 1830  AST 27  ALT 16  ALKPHOS 71  BILITOT 0.9  PROT 7.3  ALBUMIN 4.0   No results found for this basename: LIPASE, AMYLASE,  in the last 168 hours No results found for this basename: AMMONIA,  in the last 168 hours CBC:  Recent Labs Lab 07/11/13 1830  WBC 7.4  NEUTROABS 5.0  HGB 12.1*  HCT 34.9*  MCV 94.1  PLT 244   Cardiac Enzymes:  Recent Labs Lab 07/12/13 0200 07/12/13 0450 07/12/13 1309  TROPONINI <0.30 <0.30 <0.30   BNP: BNP (last 3 results)  Recent Labs  07/11/13 1830  PROBNP 16437.0*     Signed:  Jesseka Drinkard K  [facility] 07/12/2013, 2:00 PM

## 2013-07-12 NOTE — Consult Note (Signed)
Reason for Consult:  Chest tightness/SOB Referring Physician: Cristopher Stark is an 79 y.o. male.  HPI: Blake Stark is a 79 y.o. male with a history of moderate to severe aortic insufficiency and mitral regurgitation as well as nonischemic cardiomyopathy with an ejection fraction of 25-35%. The last 2-D echocardiogram was April 2010 and also showed moderate to severe left atrial dilatation. His history also includes large thoracic aortic aneurysm, atrial fibrillation, hypertension, and prostate cancer.  He was catheterized by Dr. Garnette Scheuermann in 2000 and found to have minimal CAD and normal right heart pressures.  Thoracic aortic aneurysm repair was discussed with Dr. Allyson Sabal, and the patient does not want to address it because of his age and comorbidities.  He is not on anticoagulation. I saw the patient in June 2014 at which time he had a 2D echo which showed improvement in EF to 45% with an AVA of 0.84cm^2 and moderate regurgitation.  LV severely dilated.  Moderate MR.  LA massively dilated. PA pres .   He presents today with SOB and chest tightness.  BNP is elevated at 16K.  Initial Trop POC 0.12 but subsequent troponin negative.  The patient reports developing "Food Poisoning" a week ago with nausea, vomiting, SOB, cough and chest pain.  He took Naproxen and it would relieve the pain.  He thinks he was vomiting up his lasix, and other medications, which cause his legs to swell yesterday.  His chest was most severe yesterday and he said it felt like it was "exploding out".  He has had none since.      Past Medical History  Diagnosis Date  . Cancer 03-05-12    Prostate cancer  . Hypertension   . Dysrhythmia 03-05-12    occ. skip beat, hx. RBBB  . Arthritis 03-05-12    severe arthritis right hip  . Blood transfusion 03-05-12    tx. ulcer-many yrs ago.  . CHF (congestive heart failure)     Past Surgical History  Procedure Laterality Date  . Prostate surgery  03-05-12   surgery/ with radiation  later after PSA Increased  . Appendectomy  03-05-12    '62  . Hernia repair  03-05-12    RIH  . Knee arthroscopy  03-05-12    Bilateral  . Total hip arthroplasty  03/07/2012    Procedure: TOTAL HIP ARTHROPLASTY ANTERIOR APPROACH;  Surgeon: Kathryne Hitch, MD;  Location: [facility] ORS;  Service: Orthopedics;  Laterality: Right;  Right Total Hip Arthroplasty, Direct Anterior    History reviewed. No pertinent family history.  Social History:  reports that he has never smoked. He does not have any smokeless tobacco history on file. He reports that he does not drink alcohol or use illicit drugs.  Allergies:  Allergies  Allergen Reactions  . Sanctura [Trospium]     Reaction=heart failure per patient??    Medications: Scheduled Meds: . ALPRAZolam  0.5 mg Oral QHS  . amiodarone  100 mg Oral Daily  . aspirin EC  325 mg Oral Daily  . Chlorhexidine Gluconate Cloth  6 each Topical Q0600  . docusate sodium  100 mg Oral BID  . enalapril  20 mg Oral Q breakfast  . furosemide  40 mg Intravenous Daily  . heparin  5,000 Units Subcutaneous Q8H  . mupirocin ointment  1 application Nasal BID  . omega-3 acid ethyl esters  1 g Oral Q breakfast  . sodium chloride  3 mL Intravenous Q12H   Continuous Infusions:  PRN Meds:.morphine injection, nitroGLYCERIN, ondansetron (ZOFRAN) IV, ondansetron, zolpidem   Results for orders placed during the hospital encounter of 07/11/13 (from the past 48 hour(s))  PRO B NATRIURETIC PEPTIDE     Status: Abnormal   Collection Time    07/11/13  6:30 PM      Result Value Range   Pro B Natriuretic peptide (BNP) 16437.0 (*) 0 - 450 pg/mL  CBC WITH DIFFERENTIAL     Status: Abnormal   Collection Time    07/11/13  6:30 PM      Result Value Range   WBC 7.4  4.0 - 10.5 K/uL   RBC 3.71 (*) 4.22 - 5.81 MIL/uL   Hemoglobin 12.1 (*) 13.0 - 17.0 g/dL   HCT 16.1 (*) 09.6 - 04.5 %   MCV 94.1  78.0 - 100.0 fL   MCH 32.6  26.0 - 34.0 pg   MCHC 34.7   30.0 - 36.0 g/dL   RDW 40.9  81.1 - 91.4 %   Platelets 244  150 - 400 K/uL   Neutrophils Relative % 67  43 - 77 %   Neutro Abs 5.0  1.7 - 7.7 K/uL   Lymphocytes Relative 16  12 - 46 %   Lymphs Abs 1.2  0.7 - 4.0 K/uL   Monocytes Relative 17 (*) 3 - 12 %   Monocytes Absolute 1.2 (*) 0.1 - 1.0 K/uL   Eosinophils Relative 1  0 - 5 %   Eosinophils Absolute 0.0  0.0 - 0.7 K/uL   Basophils Relative 0  0 - 1 %   Basophils Absolute 0.0  0.0 - 0.1 K/uL  COMPREHENSIVE METABOLIC PANEL     Status: Abnormal   Collection Time    07/11/13  6:30 PM      Result Value Range   Sodium 136  135 - 145 mEq/L   Potassium 4.8  3.5 - 5.1 mEq/L   Chloride 102  96 - 112 mEq/L   CO2 21  19 - 32 mEq/L   Glucose, Bld 97  70 - 99 mg/dL   BUN 19  6 - 23 mg/dL   Creatinine, Ser 7.82  0.50 - 1.35 mg/dL   Calcium 8.7  8.4 - 95.6 mg/dL   Total Protein 7.3  6.0 - 8.3 g/dL   Albumin 4.0  3.5 - 5.2 g/dL   AST 27  0 - 37 U/L   ALT 16  0 - 53 U/L   Alkaline Phosphatase 71  39 - 117 U/L   Total Bilirubin 0.9  0.3 - 1.2 mg/dL   GFR calc non Af Amer 63 (*) >90 mL/min   GFR calc Af Amer 73 (*) >90 mL/min   Comment: (NOTE)     The eGFR has been calculated using the CKD EPI equation.     This calculation has not been validated in all clinical situations.     eGFR's persistently <90 mL/min signify possible Chronic Kidney     Disease.  POCT I-STAT TROPONIN I     Status: Abnormal   Collection Time    07/11/13  6:37 PM      Result Value Range   Troponin i, poc 0.12 (*) 0.00 - 0.08 ng/mL   Comment 3            Comment: Due to the release kinetics of cTnI,     a negative result within the first hours     of the onset of symptoms does not rule out  myocardial infarction with certainty.     If myocardial infarction is still suspected,     repeat the test at appropriate intervals.  MRSA PCR SCREENING     Status: Abnormal   Collection Time    07/12/13 12:01 AM      Result Value Range   MRSA by PCR POSITIVE (*)  NEGATIVE   Comment:            The GeneXpert MRSA Assay (FDA     approved for NASAL specimens     only), is one component of a     comprehensive MRSA colonization     surveillance program. It is not     intended to diagnose MRSA     infection nor to guide or     monitor treatment for     MRSA infections.     RESULT CALLED TO, READ BACK BY AND VERIFIED WITH:     Juanell Fairly RN [phone] GREEN R  TROPONIN I     Status: None   Collection Time    07/12/13  2:00 AM      Result Value Range   Troponin I <0.30  <0.30 ng/mL   Comment:            Due to the release kinetics of cTnI,     a negative result within the first hours     of the onset of symptoms does not rule out     myocardial infarction with certainty.     If myocardial infarction is still suspected,     repeat the test at appropriate intervals.  TROPONIN I     Status: None   Collection Time    07/12/13  4:50 AM      Result Value Range   Troponin I <0.30  <0.30 ng/mL   Comment:            Due to the release kinetics of cTnI,     a negative result within the first hours     of the onset of symptoms does not rule out     myocardial infarction with certainty.     If myocardial infarction is still suspected,     repeat the test at appropriate intervals.    Dg Chest 2 View  07/11/2013   CLINICAL DATA:  Chest pain.  EXAM: CHEST - 2 VIEW  COMPARISON:  05/25/2013 chest x-ray at [facility] [place]  FINDINGS: Stable significant cardiac enlargement. There is increasing pulmonary vascularity suggestive of interstitial edema compared to the prior chest x-ray. There are small bilateral pleural effusions. No focal airspace consolidation is identified. No nodules are detected. The bony thorax is unremarkable.  IMPRESSION: Interstitial edema with small bilateral pleural effusions.   Electronically Signed   By: Irish Lack M.D.   On: 07/11/2013 20:25   03/23/13 Study Conclusions  - Left ventricle: The cavity size was  severely dilated. There was hypertrophy, with an appearance of severe eccentric hypertrophy. Dilated end-systolic dimensions -- 5.35 cm Systolic function was mildly to moderately reduced. The estimated ejection fraction was 45%, in the range of 40% to 50%. - Regional wall motion abnormality: Moderate hypokinesis of the basal inferoseptal and mid inferior myocardium; mild hypokinesis of the mid inferoseptal myocardium. - Aortic valve: Mild diffuse calcification and nodularity, most notably of the Right coronary cusp. Right coronary cusp mobility was mildly restricted. Transvalvular velocity was increased, due to stenosis. There was moderate stenosis. Moderate regurgitation. Valve area: 0.84cm^2(VTI). Valve area:  0.79cm^2 (Vmax). - Mitral valve: Moderately calcified posteriorannulus. Leaflet separation was reduced. Mobility was restricted. Moderate regurgitation directed centrally, posteriorly, and toward the free wall. Valve area by continuity equation (using LVOT flow): 1.26cm^2. - Left atrium: The atrium was massively dilated. - Right atrium: The atrium was mildly dilated. - Pulmonary arteries: Systolic pressure was mildly to moderately increased. PA peak pressure: 45mm Hg (S). Impressions:  - A sinus bradycardia rhythm was noted on this study, making this study technically difficult for the assessment of cardiac function. Frequent ectopy was noted on this study, making this study technically difficult for the assessment of cardiac function.    Review of Systems  Constitutional: Negative for fever and diaphoresis.  HENT: Negative for congestion.   Respiratory: Positive for cough and shortness of breath.   Cardiovascular: Positive for chest pain and leg swelling. Negative for orthopnea and PND.  Gastrointestinal: Positive for nausea and vomiting. Negative for abdominal pain, blood in stool and melena.  Genitourinary: Negative for hematuria.  Musculoskeletal: Positive for  myalgias.       Arm pain  Neurological: Negative for dizziness.  All other systems reviewed and are negative.   Blood pressure 137/67, pulse 52, temperature 98.3 F (36.8 C), temperature source Oral, resp. rate 20, height 5\' 10"  (1.778 m), weight 171 lb 1.6 oz (77.61 kg), SpO2 92.00%. Physical Exam  Constitutional: He is oriented to person, place, and time. He appears well-developed and well-nourished. No distress.  HENT:  Head: Normocephalic and atraumatic.  Eyes: EOM are normal. Pupils are equal, round, and reactive to light. No scleral icterus.  Neck: Normal range of motion. Neck supple.  Cardiovascular: Normal rate, regular rhythm, S1 normal and S2 normal.   Murmur heard.  Systolic murmur is present with a grade of 2/6   Diastolic murmur is present with a grade of 1/6  Pulses:      Radial pulses are 2+ on the right side, and 2+ on the left side.       Dorsalis pedis pulses are 2+ on the right side, and 2+ on the left side.  Respiratory: Effort normal and breath sounds normal. He has no wheezes. He has no rales.  GI: Soft. Bowel sounds are normal. He exhibits no distension. There is no tenderness.  Musculoskeletal: He exhibits edema.  Trace to 1+ LEE   Lymphadenopathy:    He has no cervical adenopathy.  Neurological: He is alert and oriented to person, place, and time. He exhibits normal muscle tone.  Skin: Skin is warm and dry.  Psychiatric: He has a normal mood and affect.    Assessment/Plan: Principal Problem:   Acute on chronic systolic heart failure Active Problems:   Aortic insufficiency: moderate   Mitral regurgitation   HTN (hypertension)   Cardiomyopathy, nonischemic, ejection fraction 25-35% by 2-D echocardiogram April 2010   Thoracic aortic aneurysm   Chest pain at rest  Plan:  The patient feels a lot better and is currently pain free.  BNP was 16K and net fluids are -0.9L.  He has no complaints of orthopnea.  WBCs WNL.  Afib with controlled rate.  He looks  good and wants to go home.  Given the mild troponin increase and CP, it may be wise to do a Lexiscan, however, I do not think the patient will want to have any type of intervention if a problem was found.  Repeat BNP in AM.     Ammon Muscatello 07/12/2013, 8:43 AM

## 2013-07-12 NOTE — Discharge Instructions (Signed)
Heart Failure  Heart failure means your heart has trouble pumping blood. This makes it hard for your body to work well. Heart failure is a long-term (chronic) condition. You must take good care of yourself and follow your doctor's treatment plan.  HOME CARE   Take your heart medicine as told by your doctor.   Do not stop taking medicine unless your doctor tells you to.   Do not skip any dose of medicine.   Refill your medicines before they run out.   Only take other medicines after your doctor approves.   Stay active and follow your activity program as told by your doctor.   Rest for 1 hour before and after meals.   Eat heart healthy foods. This includes fresh or frozen fruits and vegetables, fish, lean meats, fat-free or low-fat dairy foods, whole grains, and high-fiber foods.   Do not eat more than 1500 milligrams of salt each day or as told by your doctor.   Cook in a healthy way. Roast, grill, broil, bake, poach, steam, or stir-fry foods.   Limit fluids as told by your doctor.   Weigh yourself every morning. Do this after you pee (urinate) and before you eat breakfast. Write down your weight to give to your doctor.   Take your blood pressure and write it down if your doctor tell you to.   Ask your doctor how to check your pulse. Check your pulse as told.   Lose weight if you are overweight. Maintain a healthy weight.   Stop smoking or chewing tobacco. Do not use gum or patches that help you quit without your doctor's approval.   Schedule and go to doctor visits as told.   Nonpregnant women should have no more than 1 drink a day. Men should have no more than 2 drinks a day. Talk to your doctor about drinking alcohol.   Stop drug use.   Stay current with shots (immunizations).   Manage your health conditions as told by your doctor.   Learn to manage your stress.   Rest when you are tired.   If it is really hot outside:   Avoid intense activities.    Use air conditioning or fans, or get in a cooler place.   Avoid caffeine and alcohol.   Wear loose-fitting, lightweight, and light-colored clothing.   If it is really cold outside:   Avoid intense activities.   Layer your clothing.   Wear mittens or gloves, a hat, and a scarf when going outside.   Avoid alcohol.   Learn about heart failure and get support as needed.   Get help to maintain or improve your quality of life and your ability to care for yourself as needed.  GET HELP IF:    You gain 3 lb/1.4 kg or more in 1 day or 5 lb/2.3 kg in a week.   You are more short of breath than usual.   You cannot do your normal activities.   You tire easily.   You cough more than normal, especially with activity.   You have any or more puffiness (swelling) in areas such as your hands, feet, ankles, or belly (abdomen).   You cannot sleep because it is hard to breathe.   You cough up thick spit (mucus) that is bloody.   You feel like your heart is beating fast (palpitations).   You get dizzy or lightheaded when you stand up.  GET HELP RIGHT AWAY IF:    You have   trouble breathing.   There is a change in mental status, such as becoming less alert or not being able to focus.   You have chest pain or discomfort.   You faint.   You were outside in hot weather and show signs of your body overheating (heat exhaustion):   Heavy sweating.   Muscle cramps.   Weakness.   Dizziness.   Headaches.   Fainting.   You were outside in cold weather and show signs of low body temperature (hypothermia):   Clumsiness.   Confusion.   Sleepiness.   Shivering.  MAKE SURE YOU:    Understand these instructions.   Will watch your condition.   Will get help right away if you are not doing well or get worse.  Document Released: 07/03/2008 Document Revised: 09/10/2012 Document Reviewed: 04/24/2012  [employer] Patient Information 2014 [employer].

## 2013-07-13 ENCOUNTER — Encounter: Payer: Self-pay | Admitting: Cardiovascular Disease

## 2013-07-13 NOTE — Progress Notes (Signed)
Utilization Review Completed.   Lavelle Berland, RN, BSN Nurse Case Manager  [phone]  

## 2013-07-14 ENCOUNTER — Encounter: Payer: Self-pay | Admitting: Cardiovascular Disease

## 2013-07-14 ENCOUNTER — Ambulatory Visit (INDEPENDENT_AMBULATORY_CARE_PROVIDER_SITE_OTHER): Payer: Medicare Other | Admitting: Cardiovascular Disease

## 2013-07-14 VITALS — BP 162/78 | HR 50 | Ht 70.0 in | Wt 170.7 lb

## 2013-07-14 DIAGNOSIS — I5023 Acute on chronic systolic (congestive) heart failure: Secondary | ICD-10-CM

## 2013-07-14 DIAGNOSIS — R079 Chest pain, unspecified: Secondary | ICD-10-CM

## 2013-07-14 DIAGNOSIS — I10 Essential (primary) hypertension: Secondary | ICD-10-CM

## 2013-07-14 NOTE — Progress Notes (Signed)
07/14/2013 ARDA KEADLE   November 22, 1926  [id number]  Primary Physician Pamelia Hoit, MD Primary Cardiologist: Runell Gess MD Roseanne Reno   HPI:  The patient is a very pleasant, 79 year old, mildly overweight, married Caucasian male, father of 3, grandfather to 6 grandchildren 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 I saw him August 21, 2011. He has a history of severe LV dysfunction with moderate to severe AI and MR. He also has a large thoracic aortic aneurysm. He was catheterized by Dr. Garnette Scheuermann in 2000 and found to have minimal CAD and normal right heart pressures. He denied chest pain or shortness of breath. We did discuss thoracic aortic aneurysm repair, which he does not want to address because of his age and comorbidities. We also discussed ICD therapy for primary prevention of sudden death, which does not interest him as well. He had a right total hip replacement a year ago by Dr. Rayburn Ma which he tolerated well and has afforded him better quality of life. His lab work is followed by Dr. Andrey Campanile. He is admitted for possible congestive heart failure this past weekend for discharge the following day. His BNP was elevated. He did have chest pain leading up to this was a point-of-care troponins mildly positive. He's not had a stress test in 4 years. His last echo performed 4 months ago showed an EF of 40-50% with moderate AS/AI and MR.    Current Outpatient Prescriptions  Medication Sig Dispense Refill  . ALPRAZolam (XANAX) 0.5 MG tablet Take 0.5 mg by mouth at bedtime.       Marland Kitchen amiodarone (PACERONE) 200 MG tablet Take 100 mg by mouth daily.      . enalapril (VASOTEC) 20 MG tablet Take 20 mg by mouth daily with breakfast.      . fish oil-omega-3 fatty acids 1000 MG capsule Take 1 g by mouth daily with breakfast.      . furosemide (LASIX) 40 MG tablet Take 1 tablet (40 mg total) by mouth 2 (two) times daily.  60 tablet  0  . Multiple Vitamin  (MULITIVITAMIN WITH MINERALS) TABS Take 1 tablet by mouth daily with breakfast.      . naproxen sodium (ANAPROX) 550 MG tablet Take 550 mg by mouth 2 (two) times daily as needed (for pain).       . pantoprazole (PROTONIX) 40 MG tablet Take 1 tablet by mouth daily.       No current facility-administered medications for this visit.    Allergies  Allergen Reactions  . Sanctura [Trospium]     Reaction=heart failure per patient??    History   Social History  . Marital Status: Married    Spouse Name: N/A    Number of Children: N/A  . Years of Education: N/A   Occupational History  . Not on file.   Social History Main Topics  . Smoking status: Never Smoker   . Smokeless tobacco: Not on file  . Alcohol Use: No  . Drug Use: No  . Sexual Activity: No   Other Topics Concern  . Not on file   Social History Narrative  . No narrative on file     Review of Systems: General: negative for chills, fever, night sweats or weight changes.  Cardiovascular: negative for chest pain, dyspnea on exertion, edema, orthopnea, palpitations, paroxysmal nocturnal dyspnea or shortness of breath Dermatological: negative for rash Respiratory: negative for cough or wheezing Urologic: negative for hematuria Abdominal: negative for nausea,  vomiting, diarrhea, bright red blood per rectum, melena, or hematemesis Neurologic: negative for visual changes, syncope, or dizziness All other systems reviewed and are otherwise negative except as noted above.    Blood pressure 162/78, pulse 50, height 5\' 10"  (1.778 m), weight 170 lb 11.2 oz (77.429 kg).  General appearance: alert and no distress Neck: no adenopathy, no JVD, supple, symmetrical, trachea midline, thyroid not enlarged, symmetric, no tenderness/mass/nodules and bilateral carotid bruits versus radiated murmur Lungs: clear to auscultation bilaterally Heart: typical AS/AI murmur Extremities: extremities normal, atraumatic, no cyanosis or edema  EKG  atrial fibrillation with right bundle-branch block and a ventricular response of 50  ASSESSMENT AND PLAN:   Acute on chronic systolic heart failure Patient has a history of severe LV dysfunction with aortic insufficiency and MR. He is admitted to the hospital on 10 5 and discharged the following day. His BNP was elevated and he was diuresed. Did have interstitial edema. He also had chest pain. His most recent 2-D echo performed in June revealed an EF of 45% with moderate AS  and AI as well as MR. His last stress test was 4 years ago and showed diaphragmatic attenuation versus scar. Abdomen to repeat a pharmacologic Myoview stress test.  HTN (hypertension) Borderline controlled on current medications      Runell Gess MD [facility], [facility] 07/14/2013 3:02 PM

## 2013-07-14 NOTE — Assessment & Plan Note (Signed)
Patient has a history of severe LV dysfunction with aortic insufficiency and MR. He is admitted to the hospital on 10 5 and discharged the following day. His BNP was elevated and he was diuresed. Did have interstitial edema. He also had chest pain. His most recent 2-D echo performed in June revealed an EF of 45% with moderate AS  and AI as well as MR. His last stress test was 4 years ago and showed diaphragmatic attenuation versus scar. Abdomen to repeat a pharmacologic Myoview stress test.

## 2013-07-14 NOTE — Patient Instructions (Signed)
Your physician has requested that you have a lexiscan myoview. For further information please visit [email or web address] Please follow instruction sheet, as given.  Your physician recommends that you schedule a follow-up appointment in: 6 MONTHS.

## 2013-07-14 NOTE — Assessment & Plan Note (Signed)
Borderline controlled on current medications 

## 2013-07-15 ENCOUNTER — Encounter: Payer: Self-pay | Admitting: Cardiovascular Disease

## 2013-07-17 ENCOUNTER — Ambulatory Visit (HOSPITAL_COMMUNITY)
Admission: RE | Admit: 2013-07-17 | Discharge: 2013-07-17 | Disposition: A | Discharge status: Home / Self Care | Payer: Medicare Other | Source: Ambulatory Visit | Attending: Internal Medicine | Admitting: Internal Medicine

## 2013-07-17 DIAGNOSIS — R002 Palpitations: Secondary | ICD-10-CM | POA: Insufficient documentation

## 2013-07-17 DIAGNOSIS — Z8249 Family history of ischemic heart disease and other diseases of the circulatory system: Secondary | ICD-10-CM | POA: Insufficient documentation

## 2013-07-17 DIAGNOSIS — I4510 Unspecified right bundle-branch block: Secondary | ICD-10-CM | POA: Insufficient documentation

## 2013-07-17 DIAGNOSIS — R42 Dizziness and giddiness: Secondary | ICD-10-CM | POA: Insufficient documentation

## 2013-07-17 DIAGNOSIS — I5022 Chronic systolic (congestive) heart failure: Secondary | ICD-10-CM | POA: Insufficient documentation

## 2013-07-17 DIAGNOSIS — I080 Rheumatic disorders of both mitral and aortic valves: Secondary | ICD-10-CM | POA: Insufficient documentation

## 2013-07-17 DIAGNOSIS — I517 Cardiomegaly: Secondary | ICD-10-CM | POA: Insufficient documentation

## 2013-07-17 DIAGNOSIS — R5381 Other malaise: Secondary | ICD-10-CM | POA: Insufficient documentation

## 2013-07-17 DIAGNOSIS — I10 Essential (primary) hypertension: Secondary | ICD-10-CM | POA: Insufficient documentation

## 2013-07-17 DIAGNOSIS — R079 Chest pain, unspecified: Secondary | ICD-10-CM | POA: Insufficient documentation

## 2013-07-17 MED ORDER — AMINOPHYLLINE 25 MG/ML IV SOLN
75.0000 mg | Freq: Once | INTRAVENOUS | Status: AC
  Administered 2013-07-17: 75 mg via INTRAVENOUS

## 2013-07-17 MED ORDER — REGADENOSON 0.4 MG/5ML IV SOLN
0.4000 mg | Freq: Once | INTRAVENOUS | Status: AC
  Administered 2013-07-17: 0.4 mg via INTRAVENOUS

## 2013-07-17 MED ORDER — TECHNETIUM TC 99M SESTAMIBI GENERIC - CARDIOLITE
10.1000 | Freq: Once | INTRAVENOUS | Status: AC | PRN
  Administered 2013-07-17: 10.1 via INTRAVENOUS

## 2013-07-17 MED ORDER — TECHNETIUM TC 99M SESTAMIBI GENERIC - CARDIOLITE
32.0000 | Freq: Once | INTRAVENOUS | Status: AC | PRN
  Administered 2013-07-17: 32 via INTRAVENOUS

## 2013-07-17 NOTE — Procedures (Addendum)
[name] [address] [place] [address] [place] [address] [phone]  Cardiology Nuclear Med Study  Blake Stark is a 79 y.o. male     MRN : [id number]     DOB: 02/18/1927  Procedure Date: 07/17/2013  Nuclear Med Background Indication for Stress Test:  Post Hospital and Abnormal EKG History:  Acute on chronic systolic heart failure;Mitral valve regurg;Moderate aortic insufficiency Cardiac Risk Factors: Family History - CAD, Hypertension, RBBB and Cardiomyopathy  Symptoms:  Chest Pain, Dizziness, Fatigue and Palpitations   Nuclear Pre-Procedure Caffeine/Decaff Intake:  9:00pm NPO After: 7:00am   IV Site: R Hand  IV 0.9% NS with Angio Cath:  22g  Chest Size (in):  38"  IV Started by: Emmit Pomfret, RN  Height: 5\' 10"  (1.778 m)  Cup Size: n/a  BMI:  Body mass index is 24.39 kg/(m^2). Weight:  170 lb (77.111 kg)   Tech Comments:  n/a    Nuclear Med Study 1 or 2 day study: 1 day  Stress Test Type:  Lexiscan  Order Authorizing Provider:  Nanetta Batty, MD    Resting Radionuclide: Technetium 15m Sestamibi  Resting Radionuclide Dose: 10.1 mCi   Stress Radionuclide:  Technetium 33m Sestamibi  Stress Radionuclide Dose: 32.0 mCi           Stress Protocol Rest HR: 41 Stress HR: 50  Rest BP: 158/73 Stress BP: 165/71  Exercise Time (min): n/a METS: n/a   Predicted Max HR: 136 bpm % Max HR: 52.21 bpm Rate Pressure Product: 21308  Dose of Adenosine (mg):  n/a Dose of Lexiscan: 0.4 mg  Dose of Atropine (mg): n/a Dose of Dobutamine: n/a mcg/kg/min (at max HR)  Stress Test Technologist: Esperanza Sheets, CCT Nuclear Technologist: Gonzella Lex, CNMT   Rest Procedure:  Myocardial perfusion imaging was performed at rest 45 minutes following the intravenous administration of Technetium 7m Sestamibi. Stress Procedure:  The patient received IV Lexiscan 0.4 mg over 15-seconds.  Technetium 1m Sestamibi injected at 30-seconds.  The  patient experienced chest heaviness and SOB; 75 mg of IV Aminophylline was administered with resolution of symptoms. There were no significant changes with Lexiscan.  Quantitative spect images were obtained after a 45 minute delay.  Transient Ischemic Dilatation (Normal <1.22):  1.03 Lung/Heart Ratio (Normal <0.45):  0.33 QGS EDV:  n/a ml QGS ESV:  n/a ml LV Ejection Fraction: Study not gated   Signed by Gonzella Lex, CNMT  PHYSICIAN INTERPRETATION  Rest ECG: Atrial Fibrilliation and ~slow VR; biphasic ST-T segments with TWI in II,III, aVF & V5-V6.  Stress ECG: The ST changes were more pronounced with Lexiscan infusion  QPS Raw Data Images:  Acquisition technically good; normal left ventricular size. Stress Images:  There is a moderate sized, medium intensity perfusion defect in the mid to apical anteroseptal wall as well as the apex and inferolateral apex.  The Inferoapical segment is partially reversiblem, while the anteroseptal region is reversible.  Rest Images:  There is decreased uptake in the apex. Subtraction (SDS):  There is a fixed defect that is most consistent with a previous infarction. However there is also reversibility noted that would suggests ischemia in the distal LAD distribution.  Impression Exercise Capacity:  Lexiscan with no exercise. BP Response:  Normal blood pressure response. Clinical Symptoms:  Mild chest pain/dyspnea. ECG Impression:  There was slight enhancement of baselien ST-T abnormalities. Comparison with Prior Nuclear Study: No images to compare LV Wall Motion:  Note assessed.  Overall Impression:  Intermediate risk stress nuclear study with a partially reversible perfusin defect involving the mid to apical inferoseptal wall extending to the apex - inferolateral apex.  This is suggestive of potential infarct with moderate per-infarct ischemia.  Clinical correlation is recommended.Marland Kitchen     Marykay Lex, MD  07/17/2013 1:20 PM

## 2013-07-29 ENCOUNTER — Encounter (HOSPITAL_COMMUNITY): Payer: Self-pay | Admitting: Pharmacy Technician

## 2013-07-29 ENCOUNTER — Ambulatory Visit (INDEPENDENT_AMBULATORY_CARE_PROVIDER_SITE_OTHER): Payer: Medicare Other | Admitting: Cardiovascular Disease

## 2013-07-29 ENCOUNTER — Encounter: Payer: Self-pay | Admitting: Cardiovascular Disease

## 2013-07-29 VITALS — BP 146/64 | HR 60 | Ht 70.0 in | Wt 168.1 lb

## 2013-07-29 DIAGNOSIS — D689 Coagulation defect, unspecified: Secondary | ICD-10-CM

## 2013-07-29 DIAGNOSIS — Z01818 Encounter for other preprocedural examination: Secondary | ICD-10-CM

## 2013-07-29 DIAGNOSIS — Z79899 Other long term (current) drug therapy: Secondary | ICD-10-CM

## 2013-07-29 DIAGNOSIS — R079 Chest pain, unspecified: Secondary | ICD-10-CM

## 2013-07-29 LAB — PROTIME-INR
INR: 1.09 (ref <1.50)
Prothrombin Time: 14.1 seconds (ref 11.6–15.2)

## 2013-07-29 LAB — APTT: aPTT: 34 seconds (ref 24–37)

## 2013-07-29 LAB — BASIC METABOLIC PANEL
BUN: 20 mg/dL (ref 6–23)
CO2: 28 mEq/L (ref 19–32)
Calcium: 9.7 mg/dL (ref 8.4–10.5)
Chloride: 102 mEq/L (ref 96–112)
Creat: 1.35 mg/dL (ref 0.50–1.35)

## 2013-07-29 LAB — CBC
Hemoglobin: 12.4 g/dL — ABNORMAL LOW (ref 13.0–17.0)
MCH: 30.9 pg (ref 26.0–34.0)
RBC: 4.01 MIL/uL — ABNORMAL LOW (ref 4.22–5.81)

## 2013-07-29 NOTE — Assessment & Plan Note (Signed)
Patient had a Myoview stress test performed recently that did show anterolateral and septal ischemia new from his prior stress test done 4 years ago. Based on this and the fact that his recent consultation was related to chest pain I feel compelled to proceed with outpatient diagnostic coronary triopathy. He does have moderate left ear/AI and MR as well with an EF of 45-50% by 2-D echo performed back in June.

## 2013-07-29 NOTE — Progress Notes (Signed)
07/29/2013 Blake Stark   01-12-1927  [id number]  Primary Physician Pamelia Hoit, MD Primary Cardiologist: Runell Gess MD Roseanne Reno   HPI:  The patient is a very pleasant, 79 year old, mildly overweight, married Caucasian male, father of 3, grandfather to 6 grandchildren 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 I saw him August 21, 2011. He has a history of severe LV dysfunction with moderate to severe AI and MR. He also has a large thoracic aortic aneurysm. He was catheterized by Dr. Garnette Scheuermann in 2000 and found to have minimal CAD and normal right heart pressures. He denied chest pain or shortness of breath. We did discuss thoracic aortic aneurysm repair, which he does not want to address because of his age and comorbidities. We also discussed ICD therapy for primary prevention of sudden death, which does not interest him as well. He had a right total hip replacement a year ago by Dr. Rayburn Ma which he tolerated well and has afforded him better quality of life. His lab work is followed by Dr. Andrey Campanile. He is admitted for possible congestive heart failure this past weekend for discharge the following day. His BNP was elevated. He did have chest pain leading up to this was a point-of-care troponins mildly positive. He's not had a stress test in 4 years. His last echo performed 4 months ago showed an EF of 40-50% with moderate AS/AI and MR. He had a mildly performed since I saw him ago that showed interval lateral and septal ischemia given his prior stress test performed 3 years ago. His EF is in the 45-50% range by echo back in June. He has a thoracic aortic aneurysm, ASA IMR. He did have an episode of chest pain again yesterday morning similar to the episode the prompted his admission recently. Based on this I elected to proceed with operation cardiac catheterization.  Fairly plain the risks and benefits the patient and his daughter.    Current Outpatient  Prescriptions  Medication Sig Dispense Refill  . ALPRAZolam (XANAX) 0.5 MG tablet Take 0.5 mg by mouth at bedtime.       Marland Kitchen amiodarone (PACERONE) 200 MG tablet Take 100 mg by mouth daily.      . enalapril (VASOTEC) 20 MG tablet Take 20 mg by mouth daily with breakfast.      . fish oil-omega-3 fatty acids 1000 MG capsule Take 1 g by mouth daily with breakfast.      . furosemide (LASIX) 40 MG tablet Take 1 tablet (40 mg total) by mouth 2 (two) times daily.  60 tablet  0  . Multiple Vitamin (MULITIVITAMIN WITH MINERALS) TABS Take 1 tablet by mouth daily with breakfast.      . naproxen sodium (ANAPROX) 550 MG tablet Take 550 mg by mouth 2 (two) times daily as needed (for pain).       . pantoprazole (PROTONIX) 40 MG tablet Take 1 tablet by mouth daily.       No current facility-administered medications for this visit.    Allergies  Allergen Reactions  . Sanctura [Trospium]     Reaction=heart failure per patient??    History   Social History  . Marital Status: Married    Spouse Name: N/A    Number of Children: N/A  . Years of Education: N/A   Occupational History  . Not on file.   Social History Main Topics  . Smoking status: Never Smoker   . Smokeless tobacco: Not on file  .  Alcohol Use: No  . Drug Use: No  . Sexual Activity: No   Other Topics Concern  . Not on file   Social History Narrative  . No narrative on file     Review of Systems: General: negative for chills, fever, night sweats or weight changes.  Cardiovascular: negative for chest pain, dyspnea on exertion, edema, orthopnea, palpitations, paroxysmal nocturnal dyspnea or shortness of breath Dermatological: negative for rash Respiratory: negative for cough or wheezing Urologic: negative for hematuria Abdominal: negative for nausea, vomiting, diarrhea, bright red blood per rectum, melena, or hematemesis Neurologic: negative for visual changes, syncope, or dizziness All other systems reviewed and are otherwise  negative except as noted above.    Blood pressure 146/64, pulse 60, height 5\' 10"  (1.778 m), weight 168 lb 1.6 oz (76.25 kg).  General appearance: alert and no distress Neck: no adenopathy, no carotid bruit, no JVD, supple, symmetrical, trachea midline and thyroid not enlarged, symmetric, no tenderness/mass/nodules Lungs: clear to auscultation bilaterally Heart: AS/AI murmur Extremities: extremities normal, atraumatic, no cyanosis or edema  EKG sinus bradycardia at 51 with right bundle branch block  ASSESSMENT AND PLAN:   Chest pain at rest Patient had a Myoview stress test performed recently that did show anterolateral and septal ischemia new from his prior stress test done 4 years ago. Based on this and the fact that his recent consultation was related to chest pain I feel compelled to proceed with outpatient diagnostic coronary triopathy. He does have moderate left ear/AI and MR as well with an EF of 45-50% by 2-D echo performed back in June.      Runell Gess MD FACP,FACC,FAHA, [facility] 07/29/2013 11:53 AM

## 2013-07-29 NOTE — Patient Instructions (Signed)
Your physician has requested that you have a cardiac catheterization. Cardiac catheterization is used to diagnose and/or treat various heart conditions. Doctors may recommend this procedure for a number of different reasons. The most common reason is to evaluate chest pain. Chest pain can be a symptom of coronary artery disease (CAD), and cardiac catheterization can show whether plaque is narrowing or blocking your heart's arteries. This procedure is also used to evaluate the valves, as well as measure the blood flow and oxygen levels in different parts of your heart. For further information please visit [email or web address]   Following your catheterization, you will not be allowed to drive for 3 days.  No lifting, pushing, or pulling greater that 10 pounds is allowed for 1 week.  When the procedure is scheduled, you will be given a date to have bloodwork done.    Have blood work done today!!

## 2013-07-30 ENCOUNTER — Encounter (HOSPITAL_COMMUNITY): Payer: Self-pay | Admitting: *Deleted

## 2013-07-30 ENCOUNTER — Encounter (HOSPITAL_COMMUNITY)
Admission: RE | Disposition: A | Discharge status: Home / Self Care | Payer: Self-pay | Source: Ambulatory Visit | Attending: Cardiovascular Disease

## 2013-07-30 ENCOUNTER — Ambulatory Visit (HOSPITAL_COMMUNITY)
Admission: RE | Admit: 2013-07-30 | Discharge: 2013-08-03 | Disposition: A | Discharge status: Home / Self Care | Payer: Medicare Other | Source: Ambulatory Visit | Attending: Cardiovascular Disease | Admitting: Cardiovascular Disease

## 2013-07-30 DIAGNOSIS — Z79899 Other long term (current) drug therapy: Secondary | ICD-10-CM | POA: Insufficient documentation

## 2013-07-30 DIAGNOSIS — I712 Thoracic aortic aneurysm, without rupture, unspecified: Secondary | ICD-10-CM | POA: Diagnosis present

## 2013-07-30 DIAGNOSIS — R001 Bradycardia, unspecified: Secondary | ICD-10-CM | POA: Diagnosis present

## 2013-07-30 DIAGNOSIS — I5023 Acute on chronic systolic (congestive) heart failure: Secondary | ICD-10-CM

## 2013-07-30 DIAGNOSIS — I351 Nonrheumatic aortic (valve) insufficiency: Secondary | ICD-10-CM | POA: Diagnosis present

## 2013-07-30 DIAGNOSIS — D649 Anemia, unspecified: Secondary | ICD-10-CM

## 2013-07-30 DIAGNOSIS — R079 Chest pain, unspecified: Secondary | ICD-10-CM

## 2013-07-30 DIAGNOSIS — I2584 Coronary atherosclerosis due to calcified coronary lesion: Secondary | ICD-10-CM | POA: Insufficient documentation

## 2013-07-30 DIAGNOSIS — I509 Heart failure, unspecified: Secondary | ICD-10-CM | POA: Insufficient documentation

## 2013-07-30 DIAGNOSIS — Z01818 Encounter for other preprocedural examination: Secondary | ICD-10-CM

## 2013-07-30 DIAGNOSIS — I428 Other cardiomyopathies: Secondary | ICD-10-CM | POA: Diagnosis present

## 2013-07-30 DIAGNOSIS — I208 Other forms of angina pectoris: Secondary | ICD-10-CM | POA: Diagnosis present

## 2013-07-30 DIAGNOSIS — I10 Essential (primary) hypertension: Secondary | ICD-10-CM | POA: Diagnosis present

## 2013-07-30 DIAGNOSIS — I200 Unstable angina: Secondary | ICD-10-CM | POA: Insufficient documentation

## 2013-07-30 DIAGNOSIS — I2089 Other forms of angina pectoris: Secondary | ICD-10-CM | POA: Diagnosis present

## 2013-07-30 DIAGNOSIS — I350 Nonrheumatic aortic (valve) stenosis: Secondary | ICD-10-CM

## 2013-07-30 DIAGNOSIS — I5022 Chronic systolic (congestive) heart failure: Secondary | ICD-10-CM | POA: Diagnosis present

## 2013-07-30 DIAGNOSIS — E663 Overweight: Secondary | ICD-10-CM | POA: Insufficient documentation

## 2013-07-30 DIAGNOSIS — I2510 Atherosclerotic heart disease of native coronary artery without angina pectoris: Secondary | ICD-10-CM | POA: Diagnosis present

## 2013-07-30 DIAGNOSIS — I4891 Unspecified atrial fibrillation: Secondary | ICD-10-CM | POA: Diagnosis present

## 2013-07-30 DIAGNOSIS — I340 Nonrheumatic mitral (valve) insufficiency: Secondary | ICD-10-CM | POA: Diagnosis present

## 2013-07-30 DIAGNOSIS — I080 Rheumatic disorders of both mitral and aortic valves: Secondary | ICD-10-CM | POA: Insufficient documentation

## 2013-07-30 DIAGNOSIS — I498 Other specified cardiac arrhythmias: Secondary | ICD-10-CM | POA: Insufficient documentation

## 2013-07-30 HISTORY — DX: Chronic systolic (congestive) heart failure: I50.22

## 2013-07-30 HISTORY — PX: LEFT HEART CATHETERIZATION WITH CORONARY ANGIOGRAM: SHX5451

## 2013-07-30 HISTORY — DX: Unspecified atrial fibrillation: I48.91

## 2013-07-30 HISTORY — PX: CARDIAC CATHETERIZATION: SHX172

## 2013-07-30 HISTORY — DX: Bradycardia, unspecified: R00.1

## 2013-07-30 HISTORY — DX: Nonrheumatic aortic (valve) stenosis: I35.0

## 2013-07-30 SURGERY — LEFT HEART CATHETERIZATION WITH CORONARY ANGIOGRAM
Anesthesia: LOCAL

## 2013-07-30 MED ORDER — SODIUM CHLORIDE 0.9 % IJ SOLN: 3.0000 mL | Freq: Two times a day (BID) | INTRAMUSCULAR | Status: DC

## 2013-07-30 MED ORDER — ALPRAZOLAM 0.5 MG PO TABS
0.5000 mg | ORAL_TABLET | Freq: Every day | ORAL | Status: DC
  Administered 2013-07-30 – 2013-08-02 (×4): 0.5 mg via ORAL
  Filled 2013-07-30 (×4): qty 1

## 2013-07-30 MED ORDER — AMIODARONE HCL 100 MG PO TABS
100.0000 mg | ORAL_TABLET | Freq: Every day | ORAL | Status: DC
  Administered 2013-07-30 – 2013-08-01 (×2): 100 mg via ORAL
  Filled 2013-07-30 (×4): qty 1

## 2013-07-30 MED ORDER — SODIUM CHLORIDE 0.9 % IJ SOLN: 3.0000 mL | INTRAMUSCULAR | Status: DC | PRN

## 2013-07-30 MED ORDER — ASPIRIN 81 MG PO CHEW
81.0000 mg | CHEWABLE_TABLET | Freq: Every day | ORAL | Status: DC
  Administered 2013-08-01 – 2013-08-03 (×3): 81 mg via ORAL
  Filled 2013-07-30 (×4): qty 1

## 2013-07-30 MED ORDER — ASPIRIN 81 MG PO CHEW: 81.0000 mg | CHEWABLE_TABLET | ORAL | Status: DC

## 2013-07-30 MED ORDER — ONDANSETRON HCL 4 MG/2ML IJ SOLN: 4.0000 mg | Freq: Four times a day (QID) | INTRAMUSCULAR | Status: DC | PRN

## 2013-07-30 MED ORDER — MORPHINE SULFATE 2 MG/ML IJ SOLN
1.0000 mg | INTRAMUSCULAR | Status: DC | PRN
  Administered 2013-07-31: 1 mg via INTRAVENOUS
  Filled 2013-07-30: qty 1

## 2013-07-30 MED ORDER — NAPROXEN SODIUM 550 MG PO TABS: 550.0000 mg | ORAL_TABLET | Freq: Two times a day (BID) | ORAL | Status: DC | PRN

## 2013-07-30 MED ORDER — SODIUM CHLORIDE 0.9 % IV SOLN: INTRAVENOUS | Status: AC

## 2013-07-30 MED ORDER — ASPIRIN 81 MG PO CHEW
81.0000 mg | CHEWABLE_TABLET | ORAL | Status: AC
  Administered 2013-07-30: 81 mg via ORAL
  Filled 2013-07-30: qty 1

## 2013-07-30 MED ORDER — DIAZEPAM 5 MG PO TABS
5.0000 mg | ORAL_TABLET | ORAL | Status: AC
  Administered 2013-07-30: 5 mg via ORAL
  Filled 2013-07-30: qty 1

## 2013-07-30 MED ORDER — SODIUM CHLORIDE 0.9 % IV SOLN
INTRAVENOUS | Status: DC
  Administered 2013-07-30: 12:00:00 via INTRAVENOUS

## 2013-07-30 MED ORDER — NITROGLYCERIN 0.2 MG/ML ON CALL CATH LAB
INTRAVENOUS | Status: AC
  Filled 2013-07-30: qty 1

## 2013-07-30 MED ORDER — FENTANYL CITRATE 0.05 MG/ML IJ SOLN
INTRAMUSCULAR | Status: AC
  Filled 2013-07-30: qty 2

## 2013-07-30 MED ORDER — SODIUM CHLORIDE 0.9 % IV SOLN
INTRAVENOUS | Status: DC
  Administered 2013-07-31: 04:00:00 via INTRAVENOUS

## 2013-07-30 MED ORDER — PANTOPRAZOLE SODIUM 40 MG PO TBEC
40.0000 mg | DELAYED_RELEASE_TABLET | Freq: Every day | ORAL | Status: DC
  Administered 2013-07-30 – 2013-08-03 (×4): 40 mg via ORAL
  Filled 2013-07-30 (×4): qty 1

## 2013-07-30 MED ORDER — HEPARIN (PORCINE) IN NACL 2-0.9 UNIT/ML-% IJ SOLN
INTRAMUSCULAR | Status: AC
  Filled 2013-07-30: qty 1000

## 2013-07-30 MED ORDER — ACETAMINOPHEN 325 MG PO TABS
650.0000 mg | ORAL_TABLET | ORAL | Status: DC | PRN
  Administered 2013-07-31: 650 mg via ORAL
  Filled 2013-07-30: qty 2

## 2013-07-30 MED ORDER — SODIUM CHLORIDE 0.9 % IV SOLN: 250.0000 mL | INTRAVENOUS | Status: DC | PRN

## 2013-07-30 MED ORDER — ENALAPRIL MALEATE 20 MG PO TABS
20.0000 mg | ORAL_TABLET | Freq: Every day | ORAL | Status: DC
  Filled 2013-07-30 (×2): qty 1

## 2013-07-30 MED ORDER — LIDOCAINE HCL (PF) 1 % IJ SOLN
INTRAMUSCULAR | Status: AC
  Filled 2013-07-30: qty 30

## 2013-07-30 MED ORDER — BISACODYL 5 MG PO TBEC
10.0000 mg | DELAYED_RELEASE_TABLET | Freq: Every day | ORAL | Status: DC | PRN
  Administered 2013-07-30 – 2013-08-02 (×4): 10 mg via ORAL
  Filled 2013-07-30 (×4): qty 2

## 2013-07-30 MED ORDER — FUROSEMIDE 40 MG PO TABS
40.0000 mg | ORAL_TABLET | Freq: Two times a day (BID) | ORAL | Status: DC
  Administered 2013-07-30 – 2013-08-02 (×5): 40 mg via ORAL
  Filled 2013-07-30 (×10): qty 1

## 2013-07-30 MED ORDER — NAPROXEN 500 MG PO TABS
500.0000 mg | ORAL_TABLET | Freq: Two times a day (BID) | ORAL | Status: DC | PRN
  Filled 2013-07-30: qty 1

## 2013-07-30 MED ORDER — MIDAZOLAM HCL 2 MG/2ML IJ SOLN
INTRAMUSCULAR | Status: AC
  Filled 2013-07-30: qty 2

## 2013-07-30 NOTE — Progress Notes (Signed)
About 1815 patient had episode of chest pain while at rest he rated 10/10 mid sternal and radiating to bilateral arms 1 SL nitro given and pain resolved EKG done and [facility] [facility] Notified patient instructed to call for any more pain.

## 2013-07-30 NOTE — H&P (View-Only) (Signed)
   07/29/2013 Blake Stark   06/04/1927  [id number]  Primary Physician WILSON,FRED HENRY, MD Primary Cardiologist: Kit Mollett J. Marabelle Cushman MD FACP,FACC,FAHA, FSCAI   HPI:  The patient is a very pleasant, 79-year-old, mildly overweight, married Caucasian male, father of 3, grandfather to 6 grandchildren 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 I saw him August 21, 2011. He has a history of severe LV dysfunction with moderate to severe AI and MR. He also has a large thoracic aortic aneurysm. He was catheterized by Dr. Hank Smith in 2000 and found to have minimal CAD and normal right heart pressures. He denied chest pain or shortness of breath. We did discuss thoracic aortic aneurysm repair, which he does not want to address because of his age and comorbidities. We also discussed ICD therapy for primary prevention of sudden death, which does not interest him as well. He had a right total hip replacement a year ago by Dr. Blackmon which he tolerated well and has afforded him better quality of life. His lab work is followed by Dr. Wilson. He is admitted for possible congestive heart failure this past weekend for discharge the following day. His BNP was elevated. He did have chest pain leading up to this was a point-of-care troponins mildly positive. He's not had a stress test in 4 years. His last echo performed 4 months ago showed an EF of 40-50% with moderate AS/AI and MR. He had a mildly performed since I saw him ago that showed interval lateral and septal ischemia given his prior stress test performed 3 years ago. His EF is in the 45-50% range by echo back in June. He has a thoracic aortic aneurysm, ASA IMR. He did have an episode of chest pain again yesterday morning similar to the episode the prompted his admission recently. Based on this I elected to proceed with operation cardiac catheterization.  Fairly plain the risks and benefits the patient and his daughter.    Current Outpatient  Prescriptions  Medication Sig Dispense Refill  . ALPRAZolam (XANAX) 0.5 MG tablet Take 0.5 mg by mouth at bedtime.       . amiodarone (PACERONE) 200 MG tablet Take 100 mg by mouth daily.      . enalapril (VASOTEC) 20 MG tablet Take 20 mg by mouth daily with breakfast.      . fish oil-omega-3 fatty acids 1000 MG capsule Take 1 g by mouth daily with breakfast.      . furosemide (LASIX) 40 MG tablet Take 1 tablet (40 mg total) by mouth 2 (two) times daily.  60 tablet  0  . Multiple Vitamin (MULITIVITAMIN WITH MINERALS) TABS Take 1 tablet by mouth daily with breakfast.      . naproxen sodium (ANAPROX) 550 MG tablet Take 550 mg by mouth 2 (two) times daily as needed (for pain).       . pantoprazole (PROTONIX) 40 MG tablet Take 1 tablet by mouth daily.       No current facility-administered medications for this visit.    Allergies  Allergen Reactions  . Sanctura [Trospium]     Reaction=heart failure per patient??    History   Social History  . Marital Status: Married    Spouse Name: N/A    Number of Children: N/A  . Years of Education: N/A   Occupational History  . Not on file.   Social History Main Topics  . Smoking status: Never Smoker   . Smokeless tobacco: Not on file  .   Alcohol Use: No  . Drug Use: No  . Sexual Activity: No   Other Topics Concern  . Not on file   Social History Narrative  . No narrative on file     Review of Systems: General: negative for chills, fever, night sweats or weight changes.  Cardiovascular: negative for chest pain, dyspnea on exertion, edema, orthopnea, palpitations, paroxysmal nocturnal dyspnea or shortness of breath Dermatological: negative for rash Respiratory: negative for cough or wheezing Urologic: negative for hematuria Abdominal: negative for nausea, vomiting, diarrhea, bright red blood per rectum, melena, or hematemesis Neurologic: negative for visual changes, syncope, or dizziness All other systems reviewed and are otherwise  negative except as noted above.    Blood pressure 146/64, pulse 60, height 5' 10" (1.778 m), weight 168 lb 1.6 oz (76.25 kg).  General appearance: alert and no distress Neck: no adenopathy, no carotid bruit, no JVD, supple, symmetrical, trachea midline and thyroid not enlarged, symmetric, no tenderness/mass/nodules Lungs: clear to auscultation bilaterally Heart: AS/AI murmur Extremities: extremities normal, atraumatic, no cyanosis or edema  EKG sinus bradycardia at 51 with right bundle branch block  ASSESSMENT AND PLAN:   Chest pain at rest Patient had a Myoview stress test performed recently that did show anterolateral and septal ischemia new from his prior stress test done 4 years ago. Based on this and the fact that his recent consultation was related to chest pain I feel compelled to proceed with outpatient diagnostic coronary triopathy. He does have moderate left ear/AI and MR as well with an EF of 45-50% by 2-D echo performed back in June.      Trajan Grove J. Allysa Governale MD FACP,FACC,FAHA, FSCAI 07/29/2013 11:53 AM  

## 2013-07-30 NOTE — CV Procedure (Signed)
Blake Stark is a 79 y.o. male    [id number] LOCATION:  FACILITY: [facility]  PHYSICIAN: Nanetta Batty, M.D. Mar 08, 1927   DATE OF PROCEDURE:  07/30/2013  DATE OF DISCHARGE:     CARDIAC CATHETERIZATION     History obtained from chart review.The patient is a very pleasant, 79 year old, mildly overweight, married Caucasian male, father of 3, grandfather to 6 grandchildren 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 I saw him August 21, 2011. He has a history of severe LV dysfunction with moderate to severe AI and MR. He also has a large thoracic aortic aneurysm. He was catheterized by Dr. Garnette Scheuermann in 2000 and found to have minimal CAD and normal right heart pressures. He denied chest pain or shortness of breath. We did discuss thoracic aortic aneurysm repair, which he does not want to address because of his age and comorbidities. We also discussed ICD therapy for primary prevention of sudden death, which does not interest him as well. He had a right total hip replacement a year ago by Dr. Rayburn Ma which he tolerated well and has afforded him better quality of life. His lab work is followed by Dr. Andrey Campanile. He is admitted for possible congestive heart failure this past weekend for discharge the following day. His BNP was elevated. He did have chest pain leading up to this was a point-of-care troponins mildly positive. He's not had a stress test in 4 years. His last echo performed 4 months ago showed an EF of 40-50% with moderate AS/AI and MR.  He had a mildly performed since I saw him ago that showed interval lateral and septal ischemia given his prior stress test performed 3 years ago. His EF is in the 45-50% range by echo back in June. He has a thoracic aortic aneurysm, ASA IMR. He did have an episode of chest pain again yesterday morning similar to the episode the prompted his admission recently. Based on this I elected to proceed with operation cardiac catheterization.    PROCEDURE  DESCRIPTION:   The patient was brought to the second floor [facility] cath lab in the postabsorptive state. He was premedicated with Valium 5 mg by mouth, IV Versed and fentanyl. His right groinwas prepped and shaved in usual sterile fashion. Xylocaine 1% was used for local anesthesia. A 5 French sheath was inserted into the right common femoral artery using standard Seldinger technique. 5 French right and left Judkins diagnostic catheters (JL 6) were used for selective coronary angiography. The patient does have a history of aortic stenosis and I was unable to cross the valve with a pigtail catheter. He did have an echo performed in June that showed an ejection fraction of 45-50%. Visipaque was used for the entirety of the case and retrograde pressure was monitored in case. Was not crossed because the patient had a stenosis and a thoracic aortic aneurysm but his ejection fraction was noted by 2-D echocardiogram performed in June to be in the 45-50% range.  HEMODYNAMICS:    AO SYSTOLIC/AO DIASTOLIC: 137/53    ANGIOGRAPHIC RESULTS:   1. Left main; normal  2. LAD; 95% proximal eccentric calcified 3. Left circumflex; nondominant and normal, there was a moderate sized ramus branch that likewise was normal 4. Right coronary artery; dominant with insignificant disease 5. Left ventriculography;not performed  IMPRESSION:[name] is a high grade calcified eccentric proximal LAD stenosis responsible for his symptoms and Myoview abnormalities. He will need a high-speed rotational atherectomy, PTA and stenting. I will arrange  for Dr. Nicki Guadalajara to perform this tomorrow . The sheath was removed and pressure was held on the groin to achieve hemostasis. The patient left the cath lab in stable condition and    Runell Gess. MD, [facility] 07/30/2013 3:20 PM

## 2013-07-30 NOTE — Interval H&P Note (Signed)
Cath Lab Visit (complete for each Cath Lab visit)  Clinical Evaluation Leading to the Procedure:   ACS: no  Non-ACS:    Anginal Classification: CCS IV  Anti-ischemic medical therapy: Maximal Therapy (2 or more classes of medications)  Non-Invasive Test Results: Intermediate-risk stress test findings: cardiac mortality 1-3%/year  Prior CABG: No previous CABG      History and Physical Interval Note:  07/30/2013 2:29 PM  Blake Stark  has presented today for surgery, with the diagnosis of Chest pain  The various methods of treatment have been discussed with the patient and family. After consideration of risks, benefits and other options for treatment, the patient has consented to  Procedure(s): LEFT HEART CATHETERIZATION WITH CORONARY ANGIOGRAM (N/A) as a surgical intervention .  The patient's history has been reviewed, patient examined, no change in status, stable for surgery.  I have reviewed the patient's chart and labs.  Questions were answered to the patient's satisfaction.     Runell Gess

## 2013-07-30 NOTE — Progress Notes (Signed)
Notified [place]. Patient arrived to floor slow Afib rate 30's-40's with 2.77 pause patient asymptomatic. She came to review monitor no orders received I will continue to monitor patient.

## 2013-07-31 ENCOUNTER — Encounter (HOSPITAL_COMMUNITY): Payer: Self-pay | Admitting: Cardiology

## 2013-07-31 ENCOUNTER — Encounter (HOSPITAL_COMMUNITY)
Admission: RE | Disposition: A | Discharge status: Home / Self Care | Payer: Medicare Other | Source: Ambulatory Visit | Attending: Cardiovascular Disease

## 2013-07-31 DIAGNOSIS — I208 Other forms of angina pectoris: Secondary | ICD-10-CM | POA: Diagnosis present

## 2013-07-31 DIAGNOSIS — I2510 Atherosclerotic heart disease of native coronary artery without angina pectoris: Secondary | ICD-10-CM | POA: Diagnosis present

## 2013-07-31 DIAGNOSIS — I5022 Chronic systolic (congestive) heart failure: Secondary | ICD-10-CM

## 2013-07-31 DIAGNOSIS — I350 Nonrheumatic aortic (valve) stenosis: Secondary | ICD-10-CM

## 2013-07-31 DIAGNOSIS — I2089 Other forms of angina pectoris: Secondary | ICD-10-CM | POA: Diagnosis present

## 2013-07-31 DIAGNOSIS — I4891 Unspecified atrial fibrillation: Secondary | ICD-10-CM

## 2013-07-31 DIAGNOSIS — R001 Bradycardia, unspecified: Secondary | ICD-10-CM

## 2013-07-31 HISTORY — DX: Bradycardia, unspecified: R00.1

## 2013-07-31 HISTORY — DX: Nonrheumatic aortic (valve) stenosis: I35.0

## 2013-07-31 HISTORY — DX: Chronic systolic (congestive) heart failure: I50.22

## 2013-07-31 HISTORY — PX: CORONARY ANGIOPLASTY WITH STENT PLACEMENT: SHX49

## 2013-07-31 HISTORY — DX: Unspecified atrial fibrillation: I48.91

## 2013-07-31 HISTORY — PX: PERCUTANEOUS CORONARY ROTOBLATOR INTERVENTION (PCI-R): SHX5484

## 2013-07-31 LAB — CBC
HCT: 32.5 % — ABNORMAL LOW (ref 39.0–52.0)
MCV: 93.1 fL (ref 78.0–100.0)
Platelets: 213 10*3/uL (ref 150–400)
RBC: 3.49 MIL/uL — ABNORMAL LOW (ref 4.22–5.81)
WBC: 4.3 10*3/uL (ref 4.0–10.5)

## 2013-07-31 LAB — BASIC METABOLIC PANEL
CO2: 26 mEq/L (ref 19–32)
Chloride: 102 mEq/L (ref 96–112)
Creatinine, Ser: 1.14 mg/dL (ref 0.50–1.35)
Glucose, Bld: 96 mg/dL (ref 70–99)
Potassium: 3.7 mEq/L (ref 3.5–5.1)

## 2013-07-31 LAB — POCT ACTIVATED CLOTTING TIME: Activated Clotting Time: 329 seconds

## 2013-07-31 SURGERY — PERCUTANEOUS CORONARY ROTOBLATOR INTERVENTION (PCI-R)
Anesthesia: LOCAL

## 2013-07-31 MED ORDER — FENTANYL CITRATE 0.05 MG/ML IJ SOLN
INTRAMUSCULAR | Status: AC
  Filled 2013-07-31: qty 2

## 2013-07-31 MED ORDER — NITROGLYCERIN IN D5W 200-5 MCG/ML-% IV SOLN: 2.0000 ug/min | INTRAVENOUS | Status: DC

## 2013-07-31 MED ORDER — MIDAZOLAM HCL 2 MG/2ML IJ SOLN
INTRAMUSCULAR | Status: AC
  Filled 2013-07-31: qty 2

## 2013-07-31 MED ORDER — BIVALIRUDIN 250 MG IV SOLR
INTRAVENOUS | Status: AC
  Filled 2013-07-31: qty 250

## 2013-07-31 MED ORDER — HEPARIN SODIUM (PORCINE) 1000 UNIT/ML IJ SOLN
INTRAMUSCULAR | Status: AC
  Filled 2013-07-31: qty 1

## 2013-07-31 MED ORDER — SODIUM CHLORIDE 0.9 % IV SOLN
INTRAVENOUS | Status: DC
  Administered 2013-07-31: 100 mL/h via INTRAVENOUS

## 2013-07-31 MED ORDER — CLOPIDOGREL BISULFATE 75 MG PO TABS
75.0000 mg | ORAL_TABLET | Freq: Every day | ORAL | Status: DC
  Administered 2013-08-01 – 2013-08-03 (×3): 75 mg via ORAL
  Filled 2013-07-31 (×4): qty 1

## 2013-07-31 MED ORDER — LIDOCAINE HCL (PF) 1 % IJ SOLN
INTRAMUSCULAR | Status: AC
  Filled 2013-07-31: qty 30

## 2013-07-31 MED ORDER — ACETAMINOPHEN 325 MG PO TABS: 650.0000 mg | ORAL_TABLET | ORAL | Status: DC | PRN

## 2013-07-31 MED ORDER — HEPARIN (PORCINE) IN NACL 2-0.9 UNIT/ML-% IJ SOLN
INTRAMUSCULAR | Status: AC
  Filled 2013-07-31: qty 1500

## 2013-07-31 MED ORDER — ASPIRIN EC 81 MG PO TBEC: 81.0000 mg | DELAYED_RELEASE_TABLET | Freq: Every day | ORAL | Status: DC

## 2013-07-31 MED ORDER — FAMOTIDINE IN NACL 20-0.9 MG/50ML-% IV SOLN
INTRAVENOUS | Status: AC
  Filled 2013-07-31: qty 50

## 2013-07-31 MED ORDER — CHLORHEXIDINE GLUCONATE CLOTH 2 % EX PADS
6.0000 | MEDICATED_PAD | Freq: Every day | CUTANEOUS | Status: DC
  Administered 2013-07-31 – 2013-08-03 (×4): 6 via TOPICAL

## 2013-07-31 MED ORDER — SODIUM CHLORIDE 0.9 % IV SOLN
0.2500 mg/kg/h | INTRAVENOUS | Status: DC
  Filled 2013-07-31: qty 250

## 2013-07-31 MED ORDER — ONDANSETRON HCL 4 MG/2ML IJ SOLN: 4.0000 mg | Freq: Four times a day (QID) | INTRAMUSCULAR | Status: DC | PRN

## 2013-07-31 MED ORDER — ASPIRIN 81 MG PO CHEW
81.0000 mg | CHEWABLE_TABLET | ORAL | Status: AC
  Administered 2013-07-31: 324 mg via ORAL
  Filled 2013-07-31: qty 1

## 2013-07-31 MED ORDER — VERAPAMIL HCL 2.5 MG/ML IV SOLN
INTRAVENOUS | Status: AC
  Filled 2013-07-31: qty 4

## 2013-07-31 MED ORDER — MUPIROCIN 2 % EX OINT
1.0000 "application " | TOPICAL_OINTMENT | Freq: Two times a day (BID) | CUTANEOUS | Status: DC
  Administered 2013-07-31 – 2013-08-03 (×7): 1 via NASAL
  Filled 2013-07-31 (×2): qty 22

## 2013-07-31 MED ORDER — CLOPIDOGREL BISULFATE 300 MG PO TABS
ORAL_TABLET | ORAL | Status: AC
  Administered 2013-08-01: 75 mg via ORAL
  Filled 2013-07-31: qty 2

## 2013-07-31 MED ORDER — NITROGLYCERIN 0.2 MG/ML ON CALL CATH LAB
INTRAVENOUS | Status: AC
  Filled 2013-07-31: qty 1

## 2013-07-31 MED ORDER — NITROGLYCERIN IN D5W 200-5 MCG/ML-% IV SOLN
INTRAVENOUS | Status: AC
  Filled 2013-07-31: qty 250

## 2013-07-31 MED ORDER — ONDANSETRON HCL 4 MG/2ML IJ SOLN
INTRAMUSCULAR | Status: AC
  Filled 2013-07-31: qty 2

## 2013-07-31 NOTE — Progress Notes (Signed)
BP 139/70, venous sheath pulled out  from the right groin tolerated well,  Manual pressure applied for . and then followed by arterial  sheath pull and started bleeding profusely. 2 RN applied manual pressure and staff from cath lab came and continued to applied pressure for 20 min. Site is slightly bruised, soft to touch.  Dressing with tegaderm applied.pulses are palpable.. Instructed to call for any bleeding and to hold site when coughing nor sneezing. Bedrest emphasized. Continue to monitor.

## 2013-07-31 NOTE — Progress Notes (Signed)
Transferred in from the cath lab by bed with  angiomax  going at 26.5 ml/hr, sheath to right groin in placed, instructed not to bend right knee and bed rest emphasized.

## 2013-07-31 NOTE — Progress Notes (Signed)
He has a history of severe LV dysfunction with moderate to severe AI and MR. He also has a large thoracic aortic aneurysm. He was catheterized by Dr. Garnette Scheuermann in 2000 and found to have minimal CAD and normal right heart pressures. He denied chest pain or shortness of breath. We did discuss thoracic aortic aneurysm repair, which he does not want to address because of his age and comorbidities. We also discussed ICD therapy for primary prevention of sudden death, which does not interest him as well. He had a right total hip replacement a year ago by Dr. Rayburn Ma which he tolerated well and has afforded him better quality of life. His lab work is followed by Dr. Andrey Campanile. He is admitted for possible congestive heart failure this past weekend for discharge the following day. His BNP was elevated. He did have chest pain leading up to this was a point-of-care troponins mildly positive. He's not had a stress test in 4 years. His last echo performed 4 months ago showed an EF of 40-50% with moderate AS/AI and MR.  He had a mildly performed since Dr. Allyson Sabal saw him, that showed interval lateral and septal ischemia given his prior stress test performed 3 years ago. His EF is in the 45-50% range by echo back in June. He has a thoracic aortic aneurysm, ASA IMR. He did have an episode of chest pain again yesterday morning similar to the episode the prompted his admission recently. Cardiac cath planned.   Subjective: Chest pain last pm, Atrial fib with slow rate  Objective: Vital signs in last 24 hours: Temp:  [96.9 F (36.1 C)-97.9 F (36.6 C)] 96.9 F (36.1 C) (10/24 0800) Pulse Rate:  [42-75] 46 (10/24 0800) Resp:  [16-18] 16 (10/24 0800) BP: (134-198)/(42-67) 151/52 mmHg (10/24 0800) SpO2:  [95 %-97 %] 96 % (10/24 0800) Weight:  [166 lb 14.2 oz (75.7 kg)-178 lb (80.74 kg)] 166 lb 14.2 oz (75.7 kg) (10/24 0000) Weight change:  Last BM Date: 07/30/13 Intake/Output from previous day:+300 10/23 0701 -  10/24 0700 In: 300 [I.V.:300] Out: -  Intake/Output this shift:    PE: General:Pleasant affect, NAD Skin:Warm and dry, brisk capillary refill HEENT:normocephalic, sclera clear, mucus membranes moist Heart:irreg irreg with loud murmur, no gallup, rub or click Lungs:clear without rales, rhonchi, or wheezes WUJ:WJXB, non tender, + BS, do not palpate liver spleen or masses Ext:no lower ext edema, 2+ pedal pulses, 2+ radial pulses Neuro:alert and oriented, MAE, follows commands, + facial symmetry   Lab Results:  Recent Labs  07/29/13 1212 07/31/13 0644  WBC 5.8 4.3  HGB 12.4* 11.3*  HCT 35.9* 32.5*  PLT 318 213   BMET  Recent Labs  07/29/13 1212 07/31/13 0644  NA 138 138  K 4.1 3.7  CL 102 102  CO2 28 26  GLUCOSE 80 96  BUN 20 21  CREATININE 1.35 1.14  CALCIUM 9.7 8.8   No results found for this basename: TROPONINI, CK, MB,  in the last 72 hours  Lab Results  Component Value Date   CHOL  Value: 131        ATP III CLASSIFICATION:  <200     mg/dL   Desirable  147-829  mg/dL   Borderline High  >=562    mg/dL   High        11/07/8655   HDL 27* 05/27/2009   LDLCALC  Value: 97        Total Cholesterol/HDL:CHD Risk Coronary Heart  Disease Risk Table                     Men   Women  1/2 Average Risk   3.4   3.3  Average Risk       5.0   4.4  2 X Average Risk   9.6   7.1  3 X Average Risk  23.4   11.0        Use the calculated Patient Ratio above and the CHD Risk Table to determine the patient's CHD Risk.        ATP III CLASSIFICATION (LDL):  <100     mg/dL   Optimal  161-096  mg/dL   Near or Above                    Optimal  130-159  mg/dL   Borderline  045-409  mg/dL   High  >811     mg/dL   Very High 06/21/7828   TRIG 36 05/27/2009   CHOLHDL 4.9 05/27/2009   Lab Results  Component Value Date   HGBA1C  Value: 5.5 (NOTE) The ADA recommends the following therapeutic goal for glycemic control related to Hgb A1c measurement: Goal of therapy: <6.5 Hgb A1c  Reference: American Diabetes  Association: Clinical Practice Recommendations 2010, Diabetes Care, 2010, 33: (Suppl  1). 05/26/2009     Lab Results  Component Value Date   TSH 5.377* 07/12/2013     Studies/Results: Cardiac cath 07/31/13 ANGIOGRAPHIC RESULTS:  1. Left main; normal  2. LAD; 95% proximal eccentric calcified  3. Left circumflex; nondominant and normal, there was a moderate sized ramus branch that likewise was normal  4. Right coronary artery; dominant with insignificant disease  5. Left ventriculography;not performed  IMPRESSION:Mr. Long is a high grade calcified eccentric proximal LAD stenosis responsible for his symptoms and Myoview abnormalities. He will need a high-speed rotational atherectomy, PTA and stenting. I will arrange for Dr. Nicki Guadalajara to perform this tomorrow . The sheath was removed and pressure was held on the groin to achieve hemostasis. The patient left the cath lab in stable condition and  Medications: I have reviewed the patient's current medications. Scheduled Meds: . ALPRAZolam  0.5 mg Oral QHS  . amiodarone  100 mg Oral Daily  . aspirin  81 mg Oral Daily  . aspirin  81 mg Oral Pre-Cath  . Chlorhexidine Gluconate Cloth  6 each Topical Q0600  . enalapril  20 mg Oral Q breakfast  . furosemide  40 mg Oral BID  . mupirocin ointment  1 application Nasal BID  . pantoprazole  40 mg Oral Daily   Continuous Infusions: . sodium chloride 75 mL/hr at 07/31/13 0415   PRN Meds:.acetaminophen, bisacodyl, morphine injection, naproxen, ondansetron (ZOFRAN) IV  Assessment/Plan: Principal Problem:   Angina at rest Active Problems:   Aortic insufficiency: moderate   Mitral regurgitation   HTN (hypertension)   Cardiomyopathy, nonischemic, ejection fraction 25-35% by 2-D echocardiogram April 2010, EF now 45-50% by echo 03/2013   Thoracic aortic aneurysm   Chronic systolic HF (heart failure)   CAD (coronary artery disease), cath 07/30/13 LAD 95% stenosis/clacification, for high speed  rotational atherectomy/PTCA/Stent   Atrial fibrillation    Bradycardia   Aortic stenosis, moderate  PLAN: for complex procedure to eccentric calcified lesion toLAD, in Pt with AS, PAtrial fib, thoracic aneurysm. Dr. Tresa Endo has seen and examined.   LOS: 1 day   Time spent with pt. :15 minutes. FAOZHY,QMVHQ  R  Nurse Practitioner Certified Pager [phone] 07/31/2013, 8:58 AM    Patient seen and examined. Agree with assessment and plan. Cines and chart reviewed. Long discussion with patient and daughter. Pt has a h/o thoracic aneurysm, moderately severe AS/AR and severe coronary calcification with 95% eccentrically calcified proximal LAD stenosis. In addition he has AF, ? Permanent vs persistent on amiodarone, but has not been on anticoagulation. Discussed risks (including MI, vessel perforation, bleeding, contrast nephropathy  and very rarely death) and benefits of complex PCI with need for temporary pacemaker, HSRA, PTCA and stenting with patient and daughter.    Will use BMS with AF and potential anticoagulation need.    Lennette Bihari, MD, [facility] 07/31/2013 9:32 AM

## 2013-07-31 NOTE — Interval H&P Note (Signed)
Cath Lab Visit (complete for each Cath Lab visit)  Clinical Evaluation Leading to the Procedure:   ACS: no  Non-ACS:    Anginal Classification: CCS IV  Anti-ischemic medical therapy: Maximal Therapy (2 or more classes of medications)  Non-Invasive Test Results: No non-invasive testing performed  Prior CABG: No previous CABG      History and Physical Interval Note:  07/31/2013 9:42 AM  Lorenda Peck  has presented today for surgery, with the diagnosis of CAD  The various methods of treatment have been discussed with the patient and family. After consideration of risks, benefits and other options for treatment, the patient has consented to  Procedure(s): PERCUTANEOUS CORONARY ROTOBLATOR INTERVENTION (PCI-R) (N/A) as a surgical intervention .  The patient's history has been reviewed, patient examined, no change in status, stable for surgery.  I have reviewed the patient's chart and labs.  Questions were answered to the patient's satisfaction.     KELLY,THOMAS A

## 2013-07-31 NOTE — H&P (View-Only) (Signed)
    He has a history of severe LV dysfunction with moderate to severe AI and MR. He also has a large thoracic aortic aneurysm. He was catheterized by Dr. Hank Smith in 2000 and found to have minimal CAD and normal right heart pressures. He denied chest pain or shortness of breath. We did discuss thoracic aortic aneurysm repair, which he does not want to address because of his age and comorbidities. We also discussed ICD therapy for primary prevention of sudden death, which does not interest him as well. He had a right total hip replacement a year ago by Dr. Blackmon which he tolerated well and has afforded him better quality of life. His lab work is followed by Dr. Wilson. He is admitted for possible congestive heart failure this past weekend for discharge the following day. His BNP was elevated. He did have chest pain leading up to this was a point-of-care troponins mildly positive. He's not had a stress test in 4 years. His last echo performed 4 months ago showed an EF of 40-50% with moderate AS/AI and MR.  He had a mildly performed since Dr. Berry saw him, that showed interval lateral and septal ischemia given his prior stress test performed 3 years ago. His EF is in the 45-50% range by echo back in June. He has a thoracic aortic aneurysm, ASA IMR. He did have an episode of chest pain again yesterday morning similar to the episode the prompted his admission recently. Cardiac cath planned.   Subjective: Chest pain last pm, Atrial fib with slow rate  Objective: Vital signs in last 24 hours: Temp:  [96.9 F (36.1 C)-97.9 F (36.6 C)] 96.9 F (36.1 C) (10/24 0800) Pulse Rate:  [42-75] 46 (10/24 0800) Resp:  [16-18] 16 (10/24 0800) BP: (134-198)/(42-67) 151/52 mmHg (10/24 0800) SpO2:  [95 %-97 %] 96 % (10/24 0800) Weight:  [166 lb 14.2 oz (75.7 kg)-178 lb (80.74 kg)] 166 lb 14.2 oz (75.7 kg) (10/24 0000) Weight change:  Last BM Date: 07/30/13 Intake/Output from previous day:+300 10/23 0701 -  10/24 0700 In: 300 [I.V.:300] Out: -  Intake/Output this shift:    PE: General:Pleasant affect, NAD Skin:Warm and dry, brisk capillary refill HEENT:normocephalic, sclera clear, mucus membranes moist Heart:irreg irreg with loud murmur, no gallup, rub or click Lungs:clear without rales, rhonchi, or wheezes Abd:soft, non tender, + BS, do not palpate liver spleen or masses Ext:no lower ext edema, 2+ pedal pulses, 2+ radial pulses Neuro:alert and oriented, MAE, follows commands, + facial symmetry   Lab Results:  Recent Labs  07/29/13 1212 07/31/13 0644  WBC 5.8 4.3  HGB 12.4* 11.3*  HCT 35.9* 32.5*  PLT 318 213   BMET  Recent Labs  07/29/13 1212 07/31/13 0644  NA 138 138  K 4.1 3.7  CL 102 102  CO2 28 26  GLUCOSE 80 96  BUN 20 21  CREATININE 1.35 1.14  CALCIUM 9.7 8.8   No results found for this basename: TROPONINI, CK, MB,  in the last 72 hours  Lab Results  Component Value Date   CHOL  Value: 131        ATP III CLASSIFICATION:  <200     mg/dL   Desirable  200-239  mg/dL   Borderline High  >=240    mg/dL   High        05/27/2009   HDL 27* 05/27/2009   LDLCALC  Value: 97        Total Cholesterol/HDL:CHD Risk Coronary Heart   Disease Risk Table                     Men   Women  1/2 Average Risk   3.4   3.3  Average Risk       5.0   4.4  2 X Average Risk   9.6   7.1  3 X Average Risk  23.4   11.0        Use the calculated Patient Ratio above and the CHD Risk Table to determine the patient's CHD Risk.        ATP III CLASSIFICATION (LDL):  <100     mg/dL   Optimal  100-129  mg/dL   Near or Above                    Optimal  130-159  mg/dL   Borderline  160-189  mg/dL   High  >190     mg/dL   Very High 05/27/2009   TRIG 36 05/27/2009   CHOLHDL 4.9 05/27/2009   Lab Results  Component Value Date   HGBA1C  Value: 5.5 (NOTE) The ADA recommends the following therapeutic goal for glycemic control related to Hgb A1c measurement: Goal of therapy: <6.5 Hgb A1c  Reference: American Diabetes  Association: Clinical Practice Recommendations 2010, Diabetes Care, 2010, 33: (Suppl  1). 05/26/2009     Lab Results  Component Value Date   TSH 5.377* 07/12/2013     Studies/Results: Cardiac cath 07/31/13 ANGIOGRAPHIC RESULTS:  1. Left main; normal  2. LAD; 95% proximal eccentric calcified  3. Left circumflex; nondominant and normal, there was a moderate sized ramus branch that likewise was normal  4. Right coronary artery; dominant with insignificant disease  5. Left ventriculography;not performed  IMPRESSION:Mr. Long is a high grade calcified eccentric proximal LAD stenosis responsible for his symptoms and Myoview abnormalities. He will need a high-speed rotational atherectomy, PTA and stenting. I will arrange for Dr. Thomas Kelly to perform this tomorrow . The sheath was removed and pressure was held on the groin to achieve hemostasis. The patient left the cath lab in stable condition and  Medications: I have reviewed the patient's current medications. Scheduled Meds: . ALPRAZolam  0.5 mg Oral QHS  . amiodarone  100 mg Oral Daily  . aspirin  81 mg Oral Daily  . aspirin  81 mg Oral Pre-Cath  . Chlorhexidine Gluconate Cloth  6 each Topical Q0600  . enalapril  20 mg Oral Q breakfast  . furosemide  40 mg Oral BID  . mupirocin ointment  1 application Nasal BID  . pantoprazole  40 mg Oral Daily   Continuous Infusions: . sodium chloride 75 mL/hr at 07/31/13 0415   PRN Meds:.acetaminophen, bisacodyl, morphine injection, naproxen, ondansetron (ZOFRAN) IV  Assessment/Plan: Principal Problem:   Angina at rest Active Problems:   Aortic insufficiency: moderate   Mitral regurgitation   HTN (hypertension)   Cardiomyopathy, nonischemic, ejection fraction 25-35% by 2-D echocardiogram April 2010, EF now 45-50% by echo 03/2013   Thoracic aortic aneurysm   Chronic systolic HF (heart failure)   CAD (coronary artery disease), cath 07/30/13 LAD 95% stenosis/clacification, for high speed  rotational atherectomy/PTCA/Stent   Atrial fibrillation    Bradycardia   Aortic stenosis, moderate  PLAN: for complex procedure to eccentric calcified lesion toLAD, in Pt with AS, PAtrial fib, thoracic aneurysm. Dr. Kelly has seen and examined.   LOS: 1 day   Time spent with pt. :15 minutes. INGOLD,LAURA   R  Nurse Practitioner Certified Pager [phone] 07/31/2013, 8:58 AM    Patient seen and examined. Agree with assessment and plan. Cines and chart reviewed. Long discussion with patient and daughter. Pt has a h/o thoracic aneurysm, moderately severe AS/AR and severe coronary calcification with 95% eccentrically calcified proximal LAD stenosis. In addition he has AF, ? Permanent vs persistent on amiodarone, but has not been on anticoagulation. Discussed risks (including MI, vessel perforation, bleeding, contrast nephropathy  and very rarely death) and benefits of complex PCI with need for temporary pacemaker, HSRA, PTCA and stenting with patient and daughter.    Will use BMS with AF and potential anticoagulation need.    Thomas A. Kelly, MD, FACC 07/31/2013 9:32 AM                    

## 2013-07-31 NOTE — Progress Notes (Signed)
angiomax completed

## 2013-08-01 DIAGNOSIS — I359 Nonrheumatic aortic valve disorder, unspecified: Secondary | ICD-10-CM

## 2013-08-01 DIAGNOSIS — I712 Thoracic aortic aneurysm, without rupture, unspecified: Secondary | ICD-10-CM

## 2013-08-01 DIAGNOSIS — I4891 Unspecified atrial fibrillation: Secondary | ICD-10-CM

## 2013-08-01 DIAGNOSIS — Z01818 Encounter for other preprocedural examination: Secondary | ICD-10-CM

## 2013-08-01 LAB — CBC
HCT: 28.2 % — ABNORMAL LOW (ref 39.0–52.0)
Hemoglobin: 9.3 g/dL — ABNORMAL LOW (ref 13.0–17.0)
MCHC: 33 g/dL (ref 30.0–36.0)
Platelets: 222 10*3/uL (ref 150–400)
RDW: 13.8 % (ref 11.5–15.5)
WBC: 5.6 10*3/uL (ref 4.0–10.5)

## 2013-08-01 LAB — BASIC METABOLIC PANEL
BUN: 16 mg/dL (ref 6–23)
Chloride: 106 mEq/L (ref 96–112)
Creatinine, Ser: 1.22 mg/dL (ref 0.50–1.35)
GFR calc Af Amer: 61 mL/min — ABNORMAL LOW (ref >90)
GFR calc non Af Amer: 53 mL/min — ABNORMAL LOW (ref >90)
Potassium: 4.5 mEq/L (ref 3.5–5.1)

## 2013-08-01 MED ORDER — POLYSACCHARIDE IRON COMPLEX 150 MG PO CAPS
150.0000 mg | ORAL_CAPSULE | Freq: Every day | ORAL | Status: DC
  Administered 2013-08-01 – 2013-08-03 (×3): 150 mg via ORAL
  Filled 2013-08-01 (×3): qty 1

## 2013-08-01 NOTE — Progress Notes (Signed)
Subjective: Denies chest pain. No right groin, back or flank pain. Feels much better   Objective: Vital signs in last 24 hours: Temp:  [97.5 F (36.4 C)-98.6 F (37 C)] 98.5 F (36.9 C) (10/25 0730) Pulse Rate:  [54-71] 63 (10/25 0000) Resp:  [11-23] 23 (10/25 0400) BP: (109-149)/(48-75) 118/48 mmHg (10/25 0730) SpO2:  [92 %-100 %] 93 % (10/25 0730) Last BM Date: 07/30/13  Intake/Output from previous day: 10/24 0701 - 10/25 0700 In: 2080.2 [P.O.:300; I.V.:1780.2] Out: 1275 [Urine:1275] Intake/Output this shift:    Medications Current Facility-Administered Medications  Medication Dose Route Frequency Provider Last Rate Last Dose  . 0.9 %  sodium chloride infusion   Intravenous Continuous Lennette Bihari, MD 100 mL/hr at 07/31/13 2258 100 mL/hr at 07/31/13 2258  . acetaminophen (TYLENOL) tablet 650 mg  650 mg Oral Q4H PRN Runell Gess, MD   650 mg at 07/31/13 2019  . ALPRAZolam Prudy Feeler) tablet 0.5 mg  0.5 mg Oral QHS Runell Gess, MD   0.5 mg at 07/31/13 2155  . amiodarone (PACERONE) tablet 100 mg  100 mg Oral Daily Runell Gess, MD   100 mg at 07/30/13 1847  . aspirin chewable tablet 81 mg  81 mg Oral Daily Runell Gess, MD      . bisacodyl (DULCOLAX) EC tablet 10 mg  10 mg Oral Daily PRN Robbie Lis, PA-C   10 mg at 07/31/13 2019  . Chlorhexidine Gluconate Cloth 2 % PADS 6 each  6 each Topical Q0600 Runell Gess, MD   6 each at 07/31/13 0600  . clopidogrel (PLAVIX) tablet 75 mg  75 mg Oral Q breakfast Lennette Bihari, MD   75 mg at 08/01/13 0732  . furosemide (LASIX) tablet 40 mg  40 mg Oral BID Runell Gess, MD   40 mg at 08/01/13 0731  . morphine 2 MG/ML injection 1 mg  1 mg Intravenous Q1H PRN Runell Gess, MD   1 mg at 07/31/13 1821  . mupirocin ointment (BACTROBAN) 2 % 1 application  1 application Nasal BID Runell Gess, MD   1 application at 07/31/13 2155  . nitroGLYCERIN 0.2 mg/mL in dextrose 5 % infusion  2-200 mcg/min Intravenous  Continuous Lennette Bihari, MD   10 mcg/min at 07/31/13 2300  . ondansetron (ZOFRAN) injection 4 mg  4 mg Intravenous Q6H PRN Runell Gess, MD      . pantoprazole (PROTONIX) EC tablet 40 mg  40 mg Oral Daily Runell Gess, MD   40 mg at 07/30/13 2127    PE: General appearance: alert, cooperative and no distress Lungs: clear to auscultation bilaterally Heart: irreg, irreg rate 50 - 60's 2/6 murmur Abd; sofr Extremities: no LEE Pulses: 2+ and symmetric Skin: warm and dry Neurologic: Grossly normal R groin site; no hematoma or ecchymosis  Lab Results:   Recent Labs  07/29/13 1212 07/31/13 0644 08/01/13 0455  WBC 5.8 4.3 5.6  HGB 12.4* 11.3* 9.3*  HCT 35.9* 32.5* 28.2*  PLT 318 213 222   BMET  Recent Labs  07/29/13 1212 07/31/13 0644 08/01/13 0455  NA 138 138 140  K 4.1 3.7 4.5  CL 102 102 106  CO2 28 26 27   GLUCOSE 80 96 98  BUN 20 21 16   CREATININE 1.35 1.14 1.22  CALCIUM 9.7 8.8 8.1*   PT/INR  Recent Labs  07/29/13 1212  LABPROT 14.1  INR 1.09   Studies/Results:  LHC 07/31/13 -  report pending   Assessment/Plan  Principal Problem:   Angina at rest Active Problems:   Aortic insufficiency: moderate   Mitral regurgitation   HTN (hypertension)   Cardiomyopathy, nonischemic, ejection fraction 25-35% by 2-D echocardiogram April 2010, EF now 45-50% by echo 03/2013   Thoracic aortic aneurysm   Chronic systolic HF (heart failure)   CAD (coronary artery disease), cath 07/30/13 LAD 95% stenosis/clacification, for high speed rotational atherectomy/PTCA/Stent   Atrial fibrillation    Bradycardia   Aortic stenosis, moderate  Plan: Day 1 s/p difficult but successful high speed rotational atherectomy, PTA and stenting of a highly calcified proximal LAD. BP is stable.  Bradycardic w/ HR in the upper 50s-low 60s, but asymptomatic. On ASA and Plavix. No further chest pain. The right femoral access site is stable free from hematoma and bruit. Ambulate today  with RN. BP is stable. Labs ok. Plan for likely discharge either today or tomorrow.     LOS: 2 days    Blake Stark 08/01/2013 8:01 AM   Patient seen and examined. Agree with assessment and plan. Pt feels well s/p very complex PCI yesterday of extremely calcified >99% LAD stenosis. Procedure lasted 4 1/2 hrs but excellent result. Hb today decreased to 9.3 secondary to blood loss; will add emperic Fe. Pt probably has permanent rather than persistent AF. AF rate now 50-60's. BMS stent inserted 3.5 x 20  Post-dilated to 3.75 mm. He also has thoracic aneurysm, at least moderate AS and LM ostial narrowing.  Keep in hospital today. Probable dc home tomorrow if remains stable.f/u h/h, renal fxn with contrast load.    Lennette Bihari, MD, [facility] 08/01/2013 8:35 AM

## 2013-08-01 NOTE — Progress Notes (Signed)
CARDIAC REHAB PHASE I    PRE:  Rate/Rhythm: afib 68  BP:  Supine:   Sitting:135/64  Standing:   SaO2: RA  MODE:  Ambulation: 700  ft   POST:  Rate/Rhythm: 78 afib  BP:  Supine:   Sitting: 149/62  Standing:    SaO2: 96 RA Pt ambulated with min assist, tolerated well with no c/o cp or sob.  Pt to chair with daughter at bedside.  Education completed in anticipation of D/C on Sunday.  Reviewed with pt exercise guidelines, NTG protocol and to alert 911 for any unrelieved cp.  Pt and daughter verbalized understanding, handouts provided.  Pt declined outpatient cardiac rehab due to his significant involvement in his wife's care. [phone] Karlene Lineman RN, BSN   [place], [place]

## 2013-08-02 LAB — CBC
HCT: 28.3 % — ABNORMAL LOW (ref 39.0–52.0)
Hemoglobin: 9.5 g/dL — ABNORMAL LOW (ref 13.0–17.0)
MCH: 31.3 pg (ref 26.0–34.0)
MCHC: 33.6 g/dL (ref 30.0–36.0)
Platelets: 214 10*3/uL (ref 150–400)
RBC: 3.04 MIL/uL — ABNORMAL LOW (ref 4.22–5.81)

## 2013-08-02 LAB — BASIC METABOLIC PANEL
Calcium: 8.6 mg/dL (ref 8.4–10.5)
Creatinine, Ser: 1.3 mg/dL (ref 0.50–1.35)
GFR calc non Af Amer: 49 mL/min — ABNORMAL LOW (ref >90)
Potassium: 3.6 mEq/L (ref 3.5–5.1)
Sodium: 139 mEq/L (ref 135–145)

## 2013-08-02 MED ORDER — FUROSEMIDE 40 MG PO TABS
40.0000 mg | ORAL_TABLET | Freq: Every day | ORAL | Status: DC
  Administered 2013-08-03: 40 mg via ORAL
  Filled 2013-08-02: qty 1

## 2013-08-02 MED ORDER — ENALAPRIL MALEATE 5 MG PO TABS
5.0000 mg | ORAL_TABLET | Freq: Every day | ORAL | Status: DC
  Administered 2013-08-02 – 2013-08-03 (×2): 5 mg via ORAL
  Filled 2013-08-02 (×2): qty 1

## 2013-08-02 NOTE — Progress Notes (Signed)
Subjective: No complaints.   Objective: Vital signs in last 24 hours: Temp:  [97.6 F (36.4 C)-98.7 F (37.1 C)] 98.7 F (37.1 C) (10/26 0400) Pulse Rate:  [57-62] 62 (10/26 0400) Resp:  [18] 18 (10/26 0400) BP: (97-147)/(40-59) 124/59 mmHg (10/26 0443) SpO2:  [93 %-98 %] 95 % (10/26 0400) Last BM Date: 07/30/13  Intake/Output from previous day: 10/25 0701 - 10/26 0700 In: 600 [P.O.:600] Out: 700 [Urine:700] Intake/Output this shift:    Medications Current Facility-Administered Medications  Medication Dose Route Frequency Provider Last Rate Last Dose  . 0.9 %  sodium chloride infusion   Intravenous Continuous Lennette Bihari, MD 100 mL/hr at 07/31/13 2258 100 mL/hr at 07/31/13 2258  . acetaminophen (TYLENOL) tablet 650 mg  650 mg Oral Q4H PRN Runell Gess, MD   650 mg at 07/31/13 2019  . ALPRAZolam Prudy Feeler) tablet 0.5 mg  0.5 mg Oral QHS Runell Gess, MD   0.5 mg at 08/01/13 2131  . amiodarone (PACERONE) tablet 100 mg  100 mg Oral Daily Runell Gess, MD   100 mg at 08/01/13 1019  . aspirin chewable tablet 81 mg  81 mg Oral Daily Runell Gess, MD   81 mg at 08/01/13 1000  . bisacodyl (DULCOLAX) EC tablet 10 mg  10 mg Oral Daily PRN Robbie Lis, PA-C   10 mg at 08/01/13 2131  . Chlorhexidine Gluconate Cloth 2 % PADS 6 each  6 each Topical Q0600 Runell Gess, MD   6 each at 08/02/13 0600  . clopidogrel (PLAVIX) tablet 75 mg  75 mg Oral Q breakfast Lennette Bihari, MD   75 mg at 08/01/13 0732  . furosemide (LASIX) tablet 40 mg  40 mg Oral BID Runell Gess, MD   40 mg at 08/01/13 1709  . iron polysaccharides (NIFEREX) capsule 150 mg  150 mg Oral Daily Lennette Bihari, MD   150 mg at 08/01/13 1239  . morphine 2 MG/ML injection 1 mg  1 mg Intravenous Q1H PRN Runell Gess, MD   1 mg at 07/31/13 1821  . mupirocin ointment (BACTROBAN) 2 % 1 application  1 application Nasal BID Runell Gess, MD   1 application at 08/01/13 2131  . nitroGLYCERIN 0.2  mg/mL in dextrose 5 % infusion  2-200 mcg/min Intravenous Continuous Lennette Bihari, MD   10 mcg/min at 07/31/13 2300  . ondansetron (ZOFRAN) injection 4 mg  4 mg Intravenous Q6H PRN Runell Gess, MD      . pantoprazole (PROTONIX) EC tablet 40 mg  40 mg Oral Daily Runell Gess, MD   40 mg at 08/01/13 1019    PE: General appearance: alert, cooperative and no distress Lungs: clear to auscultation bilaterally Heart: irregularly irregular rhythm and 2/6 SM Extremities: no LEE Pulses: 2+ and symmetric Skin: warm and dry Neurologic: Grossly normal  Lab Results:   Recent Labs  07/31/13 0644 08/01/13 0455 08/02/13 0429  WBC 4.3 5.6 5.2  HGB 11.3* 9.3* 9.5*  HCT 32.5* 28.2* 28.3*  PLT 213 222 214   BMET  Recent Labs  07/31/13 0644 08/01/13 0455 08/02/13 0429  NA 138 140 139  K 3.7 4.5 3.6  CL 102 106 104  CO2 26 27 26   GLUCOSE 96 98 103*  BUN 21 16 16   CREATININE 1.14 1.22 1.30  CALCIUM 8.8 8.1* 8.6     Assessment/Plan  Principal Problem:   Angina at rest Active Problems:  Aortic insufficiency: moderate   Mitral regurgitation   HTN (hypertension)   Cardiomyopathy, nonischemic, ejection fraction 25-35% by 2-D echocardiogram April 2010, EF now 45-50% by echo 03/2013   Thoracic aortic aneurysm   Chronic systolic HF (heart failure)   CAD (coronary artery disease), cath 07/30/13 LAD 95% stenosis/clacification, for high speed rotational atherectomy/PTCA/Stent   Atrial fibrillation    Bradycardia   Aortic stenosis, moderate  Plan: Day 2 s/p PCI + BMS to LAD. No further CP. Continue ASA + Plavix. He continues in slow a fib. HR in the upper 50s-low 60s. Asymptomatic. BP stable. On Amiodarone. ? Anticoagulation. H/H stabilizing. Continue PO Fe. Renal function stable. MD to follow. Can likely d/c today.      LOS: 3 days    Brittainy M. Delmer Islam 08/02/2013 7:42 AM   Patient seen and examined. Agree with assessment and plan. Long discussion with patient  and daughter.  Pt feels well; no cp , sob, or edema. Now on Fe for procedure related blood loss, no hematoma or ecchymosis. ECG with progressive deeply inverted precordial T wave inversion. QTc 614, pt in AF. I suspect he has permanent AF. Will hold amiodorone and discuss with Dr. Allyson Sabal ? Dc amio if AF is chronic. Resume low dose ACE I today with Vasotec at 5 mg. Decrease lasix to 40 mg daily form bid. Anticipate DC tomorrow. F/u cbc, bmet in am   Lennette Bihari, MD, [facility] 08/02/2013 8:55 AM

## 2013-08-03 DIAGNOSIS — I2510 Atherosclerotic heart disease of native coronary artery without angina pectoris: Secondary | ICD-10-CM

## 2013-08-03 DIAGNOSIS — D649 Anemia, unspecified: Secondary | ICD-10-CM

## 2013-08-03 LAB — CBC
Hemoglobin: 9.4 g/dL — ABNORMAL LOW (ref 13.0–17.0)
MCH: 32.1 pg (ref 26.0–34.0)
Platelets: 213 10*3/uL (ref 150–400)
RBC: 2.93 MIL/uL — ABNORMAL LOW (ref 4.22–5.81)
RDW: 13.7 % (ref 11.5–15.5)
WBC: 5.1 10*3/uL (ref 4.0–10.5)

## 2013-08-03 LAB — BASIC METABOLIC PANEL
CO2: 27 mEq/L (ref 19–32)
Calcium: 8.6 mg/dL (ref 8.4–10.5)
Chloride: 103 mEq/L (ref 96–112)
Glucose, Bld: 108 mg/dL — ABNORMAL HIGH (ref 70–99)
Potassium: 3.7 mEq/L (ref 3.5–5.1)
Sodium: 141 mEq/L (ref 135–145)

## 2013-08-03 MED ORDER — CLOPIDOGREL BISULFATE 75 MG PO TABS: 75.0000 mg | ORAL_TABLET | Freq: Every day | ORAL | Status: AC

## 2013-08-03 MED ORDER — RIVAROXABAN 15 MG PO TABS: 15.0000 mg | ORAL_TABLET | Freq: Every day | ORAL | Status: AC

## 2013-08-03 MED ORDER — ENALAPRIL MALEATE 5 MG PO TABS: 5.0000 mg | ORAL_TABLET | Freq: Every day | ORAL | Status: AC

## 2013-08-03 MED ORDER — POLYSACCHARIDE IRON COMPLEX 150 MG PO CAPS: 150.0000 mg | ORAL_CAPSULE | Freq: Every day | ORAL | Status: AC

## 2013-08-03 MED ORDER — FUROSEMIDE 40 MG PO TABS: 40.0000 mg | ORAL_TABLET | Freq: Every day | ORAL | Status: AC

## 2013-08-03 MED ORDER — RIVAROXABAN 15 MG PO TABS
15.0000 mg | ORAL_TABLET | Freq: Every day | ORAL | Status: DC
  Administered 2013-08-03: 15 mg via ORAL
  Filled 2013-08-03: qty 1

## 2013-08-03 MED FILL — Sodium Chloride IV Soln 0.9%: INTRAVENOUS | Qty: 50 | Status: AC

## 2013-08-03 NOTE — Discharge Summary (Signed)
Physician Discharge Summary  Patient ID: Blake Stark MRN: [id number] DOB/AGE: 05/25/1927 79 y.o.  Admit date: 07/30/2013 Discharge date: 08/05/2013  Admission Diagnoses:  Unstable angina  Discharge Diagnoses:  Principal Problem:   Angina at rest Active Problems:   Aortic insufficiency: moderate   Mitral regurgitation   HTN (hypertension)   Cardiomyopathy, nonischemic, ejection fraction 25-35% by 2-D echocardiogram April 2010, EF now 45-50% by echo 03/2013   Thoracic aortic aneurysm   Chronic systolic HF (heart failure)   CAD (coronary artery disease), cath 07/30/13 LAD 95% stenosis/clacification, for high speed rotational atherectomy/PTCA/Stent   Atrial fibrillation    Bradycardia   Aortic stenosis, moderate   Discharged Condition: stable  Hospital Course:   The patient is a very pleasant, 79 year old, mildly overweight, married Caucasian male, father of 3, grandfather to 6 grandchildren 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 I saw him August 21, 2011. He has a history of severe LV dysfunction with moderate to severe AI and MR. He also has a large thoracic aortic aneurysm. He was catheterized by Dr. Garnette Scheuermann in 2000 and found to have minimal CAD and normal right heart pressures. He denied chest pain or shortness of breath. We did discuss thoracic aortic aneurysm repair, which he does not want to address because of his age and comorbidities. We also discussed ICD therapy for primary prevention of sudden death, which does not interest him as well. He had a right total hip replacement a year ago by Dr. Rayburn Ma which he tolerated well and has afforded him better quality of life. His lab work is followed by Dr. Andrey Campanile. He is admitted for possible congestive heart failure this past weekend for discharge the following day. His BNP was elevated. He did have chest pain leading up to this was a point-of-care troponins mildly positive. He's not had a stress test in 4 years. His  last echo performed 4 months ago showed an EF of 40-50% with moderate AS/AI and MR.   He had a nuclear stress test on 07/18/13 that showed Intermediate risk stress nuclear study with  a partially reversible perfusin defect involving the mid to  apical inferoseptal wall extending to the apex - inferolateral  apex. This is suggestive of potential infarct with moderate per-infarct ischemia.  His EF is in the 45-50% range by echo back in June. He has a thoracic aortic aneurysm, ASA IMR. He did have an episode of chest pain again yesterday morning similar to the episode the prompted his admission recently. Based on this Dr. Allyson Sabal elected to proceed with operation cardiac catheterization.    The procedure revealed a 95% proximal eccentric calcified LAD.  He then underwent successful HSRA with Dr. Tresa Endo.  He has chronic afib with slow rate and was started on Xarelto.  He was also started on iron for anemia.  The patient was seen by Dr. Tresa Endo who felt he was stable for DC home.      Consults: None  Significant Diagnostic Studies:  PROCEDURE DESCRIPTION:  The patient was brought to the second floor [facility] cath lab in the postabsorptive state. He was premedicated with Valium 5 mg by mouth, IV Versed and fentanyl. His right groinwas prepped and shaved in usual sterile fashion. Xylocaine 1% was used for local anesthesia. A 5 French sheath was inserted into the right common femoral artery using standard Seldinger technique. 5 French right and left Judkins diagnostic catheters (JL 6) were used for selective coronary angiography. The patient does have  a history of aortic stenosis and I was unable to cross the valve with a pigtail catheter. He did have an echo performed in June that showed an ejection fraction of 45-50%. Visipaque was used for the entirety of the case and retrograde pressure was monitored in case. Was not crossed because the patient had a stenosis and a thoracic aortic aneurysm but his ejection  fraction was noted by 2-D echocardiogram performed in June to be in the 45-50% range.  HEMODYNAMICS:  AO SYSTOLIC/AO DIASTOLIC: 137/53  ANGIOGRAPHIC RESULTS:  1. Left main; normal  2. LAD; 95% proximal eccentric calcified  3. Left circumflex; nondominant and normal, there was a moderate sized ramus branch that likewise was normal  4. Right coronary artery; dominant with insignificant disease  5. Left ventriculography;not performed  IMPRESSION:Blake Stark is a high grade calcified eccentric proximal LAD stenosis responsible for his symptoms and Myoview abnormalities. He will need a high-speed rotational atherectomy, PTA and stenting. I will arrange for Dr. Nicki Guadalajara to perform this tomorrow . The sheath was removed and pressure was held on the groin to achieve hemostasis. The patient left the cath lab in stable condition and  Blake Stark. MD, [facility]  07/30/2013  3:20 PM  Treatments: See above  Discharge Exam: Blood pressure 128/57, pulse 79, temperature 97.7 F (36.5 C), temperature source Oral, resp. rate 20, height 5\' 10"  (1.778 m), weight 166 lb 14.2 oz (75.7 kg), SpO2 98.00%.   Disposition: 01-Home or Self Care      Discharge Orders   Future Appointments Provider Department Dept Phone   08/07/2013 11:20 AM Nada Boozer, NP [facility] [phone]   Future Orders Complete By Expires   Diet - low sodium heart healthy  As directed    Increase activity slowly  As directed        Medication List    STOP taking these medications       amiodarone 200 MG tablet  Commonly known as:  PACERONE      TAKE these medications       ALPRAZolam 0.5 MG tablet  Commonly known as:  XANAX  Take 0.5 mg by mouth at bedtime.     clopidogrel 75 MG tablet  Commonly known as:  PLAVIX  Take 1 tablet (75 mg total) by mouth daily with breakfast.     enalapril 5 MG tablet  Commonly known as:  VASOTEC  Take 1 tablet (5 mg total) by mouth daily.     fish oil-omega-3 fatty  acids 1000 MG capsule  Take 1 g by mouth daily with breakfast.     furosemide 40 MG tablet  Commonly known as:  LASIX  Take 1 tablet (40 mg total) by mouth daily.     iron polysaccharides 150 MG capsule  Commonly known as:  NIFEREX  Take 1 capsule (150 mg total) by mouth daily.     multivitamin with minerals Tabs tablet  Take 1 tablet by mouth daily with breakfast.     naproxen sodium 550 MG tablet  Commonly known as:  ANAPROX  Take 550 mg by mouth 2 (two) times daily as needed (for pain).     pantoprazole 40 MG tablet  Commonly known as:  PROTONIX  Take 1 tablet by mouth daily.     Rivaroxaban 15 MG Tabs tablet  Commonly known as:  XARELTO  Take 1 tablet (15 mg total) by mouth daily.       Follow-up Information   Follow up with INGOLD,LAURA R,  NP On 08/07/2013. (Our office will call you witht he appt time.)    Specialty:  Cardiology   Contact information:   [address] [place] [address] [phone]       Signed: Wilburt Finlay 08/05/2013, 1:36 PM

## 2013-08-03 NOTE — Progress Notes (Signed)
Subjective: No complaints. Ready to go home.  Objective: Vital signs in last 24 hours: Temp:  [97.4 F (36.3 C)-99.2 F (37.3 C)] 97.6 F (36.4 C) (10/27 0412) Resp:  [18] 18 (10/26 1603) BP: (118-129)/(36-57) 129/49 mmHg (10/27 0400) SpO2:  [97 %-98 %] 98 % (10/26 2348) Last BM Date: 07/30/13  Intake/Output from previous day: 10/26 0701 - 10/27 0700 In: 900 [P.O.:900] Out: -  Intake/Output this shift:    Medications Current Facility-Administered Medications  Medication Dose Route Frequency Provider Last Rate Last Dose  . 0.9 %  sodium chloride infusion   Intravenous Continuous Lennette Bihari, MD 100 mL/hr at 07/31/13 2258 100 mL/hr at 07/31/13 2258  . acetaminophen (TYLENOL) tablet 650 mg  650 mg Oral Q4H PRN Runell Gess, MD   650 mg at 07/31/13 2019  . ALPRAZolam Prudy Feeler) tablet 0.5 mg  0.5 mg Oral QHS Runell Gess, MD   0.5 mg at 08/02/13 2252  . aspirin chewable tablet 81 mg  81 mg Oral Daily Runell Gess, MD   81 mg at 08/02/13 1117  . bisacodyl (DULCOLAX) EC tablet 10 mg  10 mg Oral Daily PRN Robbie Lis, PA-C   10 mg at 08/02/13 2256  . Chlorhexidine Gluconate Cloth 2 % PADS 6 each  6 each Topical Q0600 Runell Gess, MD   6 each at 08/03/13 0515  . clopidogrel (PLAVIX) tablet 75 mg  75 mg Oral Q breakfast Lennette Bihari, MD   75 mg at 08/02/13 0752  . enalapril (VASOTEC) tablet 5 mg  5 mg Oral Daily Lennette Bihari, MD   5 mg at 08/02/13 1116  . furosemide (LASIX) tablet 40 mg  40 mg Oral Daily Lennette Bihari, MD      . iron polysaccharides (NIFEREX) capsule 150 mg  150 mg Oral Daily Lennette Bihari, MD   150 mg at 08/02/13 1116  . morphine 2 MG/ML injection 1 mg  1 mg Intravenous Q1H PRN Runell Gess, MD   1 mg at 07/31/13 1821  . mupirocin ointment (BACTROBAN) 2 % 1 application  1 application Nasal BID Runell Gess, MD   1 application at 08/02/13 2252  . nitroGLYCERIN 0.2 mg/mL in dextrose 5 % infusion  2-200 mcg/min Intravenous  Continuous Lennette Bihari, MD   10 mcg/min at 07/31/13 2300  . ondansetron (ZOFRAN) injection 4 mg  4 mg Intravenous Q6H PRN Runell Gess, MD      . pantoprazole (PROTONIX) EC tablet 40 mg  40 mg Oral Daily Runell Gess, MD   40 mg at 08/02/13 1117    PE: General appearance: alert, cooperative and no distress Lungs: clear to auscultation bilaterally Heart: regular rate and rhythm and 1/6 Sys MM Extremities: No LEE Pulses: 2+ and symmetric Skin: Warm and dry Neurologic: Grossly normal  Lab Results:   Recent Labs  08/01/13 0455 08/02/13 0429 08/03/13 0415  WBC 5.6 5.2 5.1  HGB 9.3* 9.5* 9.4*  HCT 28.2* 28.3* 27.4*  PLT 222 214 213   BMET  Recent Labs  08/01/13 0455 08/02/13 0429 08/03/13 0415  NA 140 139 141  K 4.5 3.6 3.7  CL 106 104 103  CO2 27 26 27   GLUCOSE 98 103* 108*  BUN 16 16 19   CREATININE 1.22 1.30 1.39*  CALCIUM 8.1* 8.6 8.6    Assessment/Plan  Principal Problem:   Angina at rest Active Problems:   Aortic insufficiency: moderate   Mitral  regurgitation   HTN (hypertension)   Cardiomyopathy, nonischemic, ejection fraction 25-35% by 2-D echocardiogram April 2010, EF now 45-50% by echo 03/2013   Thoracic aortic aneurysm   Chronic systolic HF (heart failure)   CAD (coronary artery disease), cath 07/30/13 LAD 95% stenosis/clacification, for high speed rotational atherectomy/PTCA/Stent   Atrial fibrillation    Bradycardia   Aortic stenosis, moderate  Plan: Day 3 s/p PCI + BMS to LAD.  Continues in Afib with slow VR.  QTC 536.  ASA, plavix, enalapril, lasix40 daily, Fe. Will need anticoagulation and DC ASA.   He was on Xarelto previously.  Hgb stable.  DC home toady.      LOS: 4 days    HAGER, BRYAN 08/03/2013 8:19 AM   Patient seen and examined. Agree with assessment and plan. Feels well, anxious tro fgp home. QTc better today at 536, amiodarone dc'd yesterday. OK for dc; will start xarelto 15 mg  and dc ASA but continue Plavix for 1  month with BMS, then consider resuming 81 mg ASA. Cr 1.39, reduced dose of enalapril at 5 mg started yesterday. If HR increases as outpatient, then start beta-blocker. OV later this week with PA, the f/u 2-3 weeks with Dr. Erin Fulling, MD, [facility] 08/03/2013 12:25 PM

## 2013-08-03 NOTE — Progress Notes (Signed)
CARDIAC REHAB PHASE I   PRE:  Rate/Rhythm: 61 afib    BP: sitting 132/70    SaO2:   MODE:  Ambulation: 700 ft   POST:  Rate/Rhythm: 84 afib    BP: sitting 148/75     SaO2:   Tolerated well. No c/o. Anxious to d/c. Attempted to review ed that pt received on Saturday. Small amount of recall. However when I tried to review ed, pt wanted to voice his opinion. Difficult to clarify with pt the right way to take NTG.  [id number]  Harriet Masson CES, ACSM 08/03/2013 12:06 PM

## 2013-08-04 ENCOUNTER — Telehealth: Payer: Self-pay | Admitting: *Deleted

## 2013-08-04 ENCOUNTER — Encounter: Payer: Self-pay | Admitting: *Deleted

## 2013-08-04 NOTE — Telephone Encounter (Signed)
PA for Xarelto 15mg  PO QD faxed to [facility] on 08/04/13

## 2013-08-04 NOTE — Telephone Encounter (Signed)
Faxed documentation of PA approval of xarelto 15mg  PO QD to [facility]. Approved through 09/08/13

## 2013-08-05 NOTE — CV Procedure (Signed)
Blake Stark is a 79 y.o. male    [id number]  [id number] LOCATION:  FACILITY: [facility]  PHYSICIAN: Lennette Bihari, MD, [facility] 10-Apr-1927   DATE OF PROCEDURE:  07/31/2013    COMPLEX PERCUTANEOUS INTERVENTION    HISTORY:  Mr. Blake Stark is an 79 year old patient of Dr. Allyson Sabal who has a complex medical history consisting of a large thoracic aortic aneurysm, at least moderately to moderately severe aortic stenosis with aortic insufficiency who underwent cardiac catheterization yesterday by Dr. Allyson Sabal which revealed a severely calcified high grade eccentric greater than 95% proximal LAD stenosis. He was scheduled by Dr. Allyson Sabal for me to perform complex percutaneous intervention with need for high-speed rotational atherectomy of this severely calcified LAD lesion.   PROCEDURE:  The patient was brought to the second floor  Cardiac cath lab in the postabsorptive state. This proved to be an extremely difficult and arduous procedure which lasted greater than 4-1/2 hours. The patient was premedicated with 1 mg of Versed and 25 mcg of fentanyl. The patient has a history of atrial fibrillation which most likely is permanent and had been on amiodarone therapy with heart rates which have dropped into the 30s to 40s at night. A temporary pacemaker was initially inserted via the right femoral vein and advanced to the RV apex under continuous hemodynamic monitoring with excellent capture and threshold. His right femoral artery was punctured anteriorly and a 7 French Terumo Pinnacle sheath was inserted. Due to the thoracic aortic aneurysm a [employer] runway guide 6 [place] FL6 was inserted and this was able to cannulate the left main. He did appear to be some moderate ostial left main narrowing and visualization of the LAD confirmed a 99% eccentric calcified proximal stenosis. 600 mg of oral Plavix and I doubt and bolus plus infusion were administered. A Rotafloppy wire was initially inserted into  the LAD but was unable to cross the subtotal eccentrically calcified stenosis. A 1.5 mm Maverick over-the-wire balloon was inserted to attempt wire support to cross the lesion but this was unsuccessful. The Rotafloppy wire was then removed and a PT2 moderate support wire 300 cm was inserted. This also was unsuccessful to cross the subtotally occluded calcific calcified LAD stenosis. This was then removed and Asahi 300 cm wire was inserted which also was unsuccessful in crossing the stenosis. This was then removed and a Whisper moderate support 190 cm wire was inserted and with moderate difficulty this wire was ultimately successful in crossing the subtotal calcified stenosis. A DOC guidewire extension was then inserted. A 1.25 balloon was inserted but this was unable to cross the stenosis to allow for wire exchange. A Corsair 150 cm crossing CTO catheter was then inserted. Despite torquing at the subtotal calcified lesion this was unable to cross the stenosis. Additional attempts were made with the Medtronic sprinter Legend 1.25 balloon but again this was unsuccessful at crossing. At this point, backup was becoming exceptionally difficult with the guiding catheter now getting soft and not providing support to attempt to cross the stenosis. The entire system was then removed. There were no longer any additional FL6 guiding catheters. A Vista guide 7 [place] FL 5 was then inserted and this was ultimately able to gain access into the left main. A 300 cm whisper wire was then inserted and fortunately this wire was able to now again cross the stenosis. Further attempts utilizing the balloon 1.25 as well as a Guideliner system to help with potential possibility to allow the balloon to  cross were unsuccessful. Additional attempts were made. The balloon was then removed and the Corsair catheter was then inserted with Guideliner support and advanced to the lesion. Again this was also unsuccessful to cross the subtotal  stenosis with the Whisper wire  still in place distal to the lesion. At this point there was only one other option  and that was to remove the whisper wire with the Corsair catheter abutting against the lesion in an attempt to allow passing of a Rotafloppy wire into the stenosis. The Whisper wire was then removed. The Rotafloppy wire was significantly lubricated with the  atherectomy lubrication fluid and  the Rotafloppy wire was then inserted with Guideliner support as well as the Corsair catheter and fortunately was able to be advanced beyond Corsair catheter into the distal LAD. The glide liner and crosshair catheters were then removed. With the Rotafloppy wire in place, the temp is now made to perform the planned high-speed rotational atherectomy procedure. Initially a 1.25 mm bur was inserted after being platformed at 175,000 rpm's antibody. However, this met significant calcification in the LAD arising from the left main  And when  attempted to start it  kept stalling out. Multiple attempts were made with similar result. Consequently when the 1.25 mm burr was removed it became apparent that this it had been pushed back into the housing of the HSRA catheter due to the small size of the burr. A new 1.25 mm burr was then prepped, platformed out of body and advanced to the LAD. Several cuts were made successfully with the 1.25 mm bur. This was then done the glided out. A 1.5 mm burr was then inserted and several runs were made with the 1.5 with significant calcification debulking. This was then removed. A 3.0x10 mm [employer] cutting balloon flexed arm was then inserted and additional dilatations were made at the point of significant stenosis to attempt further ability for plaque shifting. The cutting balloon was then removed after several inflations and a 3.5x20 mm Emerge balloon was inserted and low-level dilatations were made at all sites that had undergone rotablation. Due to the patient's need for  probable anticoagulation therapy with his atrial fibrillation the decision was made to insert a bare-metal stent. A Rebel bare metal 3.5x20 mm stent was then inserted and positioned just beyond the diagonal vessel to cover the most severely initially narrowed segment. This was dilated x2 up to 12 atmospheres. A [place] Quantum apex Monorail 3.75x15 mm balloon was used for post stent dilatation with 3 inflations up to 12 atmospheres within the stented segment. Scout angiography confirmed an excellent angiographic result. With the initial balloon inflations, the patient experiences significant chest pain. This ultimately subsided with reestablishment of TIMI-3 flow. The arterial sheath was sutured in place. At the completion of the procedure the temporary pacemaker was removed which had been utilized during the procedure. The bradycardia. He left the catheterization laboratory with stable hemodynamics and chest pain-free and was transported back to the coronary care unit.    HEMODYNAMICS:   Central Aorta: 110/70     ANGIOGRAPHY:   At the start of the atherectomy procedure, left main coronary artery had smooth 60% ostial narrowing and distally prior to trifurcating into a severely calcified LAD, ramus intermediate vessel, and left circumflex vessel.  There was a 99% eccentrically placed very calcified stenosis very proximally and this essentially became flow obstructive when ultimately a wire was crossed prior to any dilatations. The patient did experience chest pain during this.  This was an extremely difficult procedure where it took over 2-1/2 hours to just get the Rotafloppy wire inserted to perform the high-speed rotational atherectomy procedure. Following  rotational atherectomy utilizing a 1.25 mm burr and a 1.5 mm burr followed by cutting balloon artherotomy with a 3.0x10 mm flextome cutting balloon, dilatation with a 3.5x20 mm Monorail balloon, and  ultimate stenting with a [employer]   bare-metal 3.5x20 mm stent, postdilated to 3.75 mm, the 99.9% stenosis was reduced to 0 to less than 5%. There was excellent stent apposition. There was brisk TIMI-3 flow in the large LAD vessel without evidence for dissection. The patient left the catheterization suite chest pain-free with stable hemodynamics.   IMPRESSION:  Extremely difficult but ultimately successful complex percutaneous coronary intervention with insertion of a temporary pacemaker, high-speed rotational atherectomy, cutting balloon atherotomy, PTCA, and stenting with a 3.5x20 mm bare-metal Rebel stent postdilated to 3.75 mm with a 99.9% eccentrically severely calcified stenosis being reduced to 0 to less than 5%.  Lennette Bihari, MD, [facility] 08/05/2013 3:43 PM

## 2013-08-07 ENCOUNTER — Ambulatory Visit (INDEPENDENT_AMBULATORY_CARE_PROVIDER_SITE_OTHER): Payer: Medicare Other | Admitting: Cardiology

## 2013-08-07 ENCOUNTER — Encounter: Payer: Self-pay | Admitting: *Deleted

## 2013-08-07 ENCOUNTER — Encounter: Payer: Self-pay | Admitting: Cardiology

## 2013-08-07 VITALS — BP 140/80 | HR 50 | Ht 70.0 in | Wt 170.0 lb

## 2013-08-07 DIAGNOSIS — I4891 Unspecified atrial fibrillation: Secondary | ICD-10-CM

## 2013-08-07 DIAGNOSIS — Z9889 Other specified postprocedural states: Secondary | ICD-10-CM

## 2013-08-07 DIAGNOSIS — R001 Bradycardia, unspecified: Secondary | ICD-10-CM

## 2013-08-07 DIAGNOSIS — I351 Nonrheumatic aortic (valve) insufficiency: Secondary | ICD-10-CM

## 2013-08-07 DIAGNOSIS — I498 Other specified cardiac arrhythmias: Secondary | ICD-10-CM

## 2013-08-07 DIAGNOSIS — I2510 Atherosclerotic heart disease of native coronary artery without angina pectoris: Secondary | ICD-10-CM

## 2013-08-07 DIAGNOSIS — Z7901 Long term (current) use of anticoagulants: Secondary | ICD-10-CM

## 2013-08-07 DIAGNOSIS — I359 Nonrheumatic aortic valve disorder, unspecified: Secondary | ICD-10-CM

## 2013-08-07 DIAGNOSIS — D62 Acute posthemorrhagic anemia: Secondary | ICD-10-CM

## 2013-08-07 HISTORY — DX: Acute posthemorrhagic anemia: D62

## 2013-08-07 NOTE — Assessment & Plan Note (Signed)
No SOB 

## 2013-08-07 NOTE — Patient Instructions (Signed)
Have blood work done on Friday of next week.  Call if any chest pain or shortness of breath.  Follow up with Dr. Allyson Sabal in 4 weeks.

## 2013-08-07 NOTE — Progress Notes (Signed)
08/07/2013   PCP: Pamelia Hoit, MD   Chief Complaint  Patient presents with  . Follow-up    post cath & stent - TK - high speed rotational atherectomy, PTA and stenting of a highly calcified proximal LAD; stopped amiodarone at discharge; worked 1/2 day yest, wants to work full-time next week; denies CP/SOB/lightheadedness/dizziness/edema; c/o feeling weak    Primary Cardiologist: Dr. 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  He has a history of severe LV dysfunction with moderate to severe AI and MR. Though EF improved to 40-50% on Echo in June 2014.  He also has a large thoracic aortic aneurysm. He was catheterized by Dr. Garnette Scheuermann in 2000 and found to have minimal CAD and normal right heart pressures. He denied chest pain or shortness of breath. Dr. Allyson Sabal did discuss thoracic aortic aneurysm repair, which he does not want to address because of his age and comorbidities. Dr. Allyson Sabal also discussed ICD therapy for primary prevention of sudden death, which does not interest him as well. He had a right total hip replacement a year ago by Dr. Rayburn Ma which he tolerated well and has afforded him better quality of life. His lab work is followed by Dr. Andrey Campanile. He was admitted for possible congestive heart failure this past weekend and discharge the following day. His BNP was elevated. He did have chest pain leading up to this was a point-of-care troponins mildly positive. He's not had a stress test in 4 years. His last echo performed 4 months ago showed an EF of 40-50% with moderate AS/AI and MR.  His EKG with Atrial fib with slow VR.  Dr. Allyson Sabal felt cardiac cath was needed after  nuc study was postive for ischemia.  His cath revealed a 95% proximal eccentric calcified LAD. He then underwent successful HSRA with Dr. Tresa Endo with cutting balloon and BMS..    Due to  chronic atrial fib pt was placed on Xarelto and ASA was stopped. Pt is also on Plavix.  Also with significant bradycardia into the 30s on tele his amiodarone was stopped as he was in permanent atrial fib.   He is here for follow up.  No chest pain, no SOB.  He feels great and wants to return to work.       Allergies  Allergen Reactions  . Lipitor [Atorvastatin]   . Sanctura [Trospium]     Reaction=heart failure per patient??  . Zetia [Ezetimibe]     Myalgias per pt.  . Zocor [Simvastatin]     Current Outpatient Prescriptions  Medication Sig Dispense Refill  . ALPRAZolam (XANAX) 0.5 MG tablet Take 0.5 mg by mouth at bedtime.       . clopidogrel (PLAVIX) 75 MG tablet Take 1 tablet (75 mg total) by mouth daily with breakfast.  30 tablet  5  . enalapril (VASOTEC) 5 MG tablet Take 1 tablet (5 mg total) by mouth daily.  30 tablet  5  . fish oil-omega-3 fatty acids 1000 MG capsule Take 1 g by mouth daily with breakfast.      . furosemide (LASIX) 40 MG tablet Take 1 tablet (40 mg total) by mouth daily.  30 tablet  5  . iron polysaccharides (NIFEREX) 150 MG capsule Take 1 capsule (150 mg total) by mouth daily.  30 capsule  5  . Multiple Vitamin (MULITIVITAMIN WITH MINERALS)  TABS Take 1 tablet by mouth daily with breakfast.      . naproxen sodium (ANAPROX) 550 MG tablet Take 550 mg by mouth 2 (two) times daily as needed (for pain).       . pantoprazole (PROTONIX) 40 MG tablet Take 1 tablet by mouth daily.      . Rivaroxaban (XARELTO) 15 MG TABS tablet Take 1 tablet (15 mg total) by mouth daily.  30 tablet  11   No current facility-administered medications for this visit.    Past Medical History  Diagnosis Date  . Cancer 03-05-12    Prostate cancer  . Hypertension   . Dysrhythmia 03-05-12    occ. skip beat, hx. RBBB  . Arthritis 03-05-12    severe arthritis right hip  . Blood transfusion 03-05-12    tx. ulcer-many yrs ago.  . CHF (congestive heart failure)   . Aortic insufficiency   .  Acute systolic heart failure   . Thoracic aortic aneurysm   . Chronic systolic HF (heart failure) 07/31/2013  . Atrial fibrillation  07/31/2013    permanent  . Bradycardia 07/31/2013    amiodarone stopped  . Aortic stenosis, moderate 07/31/2013  . H/O echocardiogram 01-18-2009/03/2013    EF 25-35% moderate to severe global hypokinesis of lt. ventrical; rt. ventrical is mildly dialated; moderate  mitral regurgitation ; moderate pulmonary hypertension; no aortic stenosis; moderate calcification of aortic valve leaflets now EF 40-50% mod AI  . History of stress test 06-06-2009    normal perfusion study;no ischemia or infarct/scar is seen in remaining myocardium; this is a lo risk scan   . Abnormal Doppler ultrasound of carotid artery 01-30-2010    normal study  . CAD in native artery 07/2013    with complex PCI to LAD  . Chronic anticoagulation 2014    for atrial fib  . Acute blood loss anemia, post procedure 08/07/2013    Past Surgical History  Procedure Laterality Date  . Prostate surgery  03-05-12    surgery/ with radiation  later after PSA Increased  . Appendectomy  03-05-12    '62  . Hernia repair  03-05-12    RIH  . Knee arthroscopy  03-05-12    Bilateral  . Total hip arthroplasty  03/07/2012    Procedure: TOTAL HIP ARTHROPLASTY ANTERIOR APPROACH;  Surgeon: Kathryne Hitch, MD;  Location: [facility];  Service: Orthopedics;  Laterality: Right;  Right Total Hip Arthroplasty, Direct Anterior  . Cardiac catheterization  02-20-1999    significant Lt. Ventricular dysfunction; aortic regurgitiation; no significan coronary disease  . Cardiac catheterization  07/30/2013    95% stenosis proximal eccentric, calcified, otherwise minimal disease   . Coronary angioplasty with stent placement  07/31/13    PCI HSRA, cutting balloon PTCA, BMS placed to prox. LAD    ZOX:[id number]:UJ colds or fevers, no weight changes Skin:no rashes or ulcers HEENT:no blurred vision, no congestion CV:see  HPI PUL:see HPI GI:no diarrhea constipation or melena, no indigestion GU:no hematuria, no dysuria MS:no joint pain, no claudication, mild to mod. Ecchymosis at cath site., no tenderness Neuro:no syncope, no lightheadedness Endo:no diabetes, no thyroid disease  PHYSICAL EXAM BP 140/80  Pulse 50  Ht 5\' 10"  (1.778 m)  Wt 170 lb (77.111 kg)  BMI 24.39 kg/m2 General:Pleasant affect, NAD Skin:Warm and dry, brisk capillary refill HEENT:normocephalic, sclera clear, mucus membranes moist Neck:supple, no JVD,+ bruits from radiation of murmur Heart:irreg irreg with 3/6 murmur, no gallup, rub or click Lungs:clear without rales, rhonchi, or wheezes  NFA:OZHY, non tender, + BS, do not palpate liver spleen or masses Ext:no lower ext edema, 2+ pedal pulses, 2+ radial pulses. + ecchymosis of rt groin. Neuro:alert and oriented, MAE, follows commands, + facial symmetry  QMV:HQIONG fib with rate of 50.  Rt BBB old.  Leftward axis.    ASSESSMENT AND PLAN CAD (coronary artery disease), cath 07/30/13 LAD 95% stenosis/clacification, for high speed rotational atherectomy/PTCA/Stent Pt stable no chest pain, no SOB.  Feels better.  Quite active and would like to back to work with [employer].  Atrial fibrillation  Now permanent.  Xarelto was added in hospital and amiodarone was stopped due to bradycardia.  Bradycardia Still bradycardic off amiodarone but symptomatic.    Aortic insufficiency: moderate No SOB  Chronic anticoagulation Now on xarelto for permanent atrial fib.  Acute blood loss anemia, post procedure On Iron. But with Xarelto with recheck CBC in 1 week and CMP.

## 2013-08-07 NOTE — Assessment & Plan Note (Signed)
Still bradycardic off amiodarone but symptomatic.

## 2013-08-07 NOTE — Assessment & Plan Note (Signed)
Now on xarelto for permanent atrial fib.

## 2013-08-07 NOTE — Assessment & Plan Note (Signed)
Pt stable no chest pain, no SOB.  Feels better.  Quite active and would like to back to work with [employer].

## 2013-08-07 NOTE — Assessment & Plan Note (Signed)
Now permanent.  Xarelto was added in hospital and amiodarone was stopped due to bradycardia.

## 2013-08-07 NOTE — Assessment & Plan Note (Signed)
On Iron. But with Xarelto with recheck CBC in 1 week and CMP.

## 2013-08-14 LAB — CBC
HCT: 27.8 % — ABNORMAL LOW (ref 39.0–52.0)
Hemoglobin: 9.6 g/dL — ABNORMAL LOW (ref 13.0–17.0)
MCV: 92.4 fL (ref 78.0–100.0)
Platelets: 320 10*3/uL (ref 150–400)
RBC: 3.01 MIL/uL — ABNORMAL LOW (ref 4.22–5.81)
WBC: 5.1 10*3/uL (ref 4.0–10.5)

## 2013-08-17 IMAGING — RF DG HIP COMPLETE 2+V*R*
1 series · 2 of 2 positions shown · non-contrast
Comparison: None.

CLINICAL DATA: Right anterior total hip replacement

RIGHT HIP - COMPLETE 2+ VIEW

[Series 1: run · 2 of 2 slices shown]
[im 1/2]
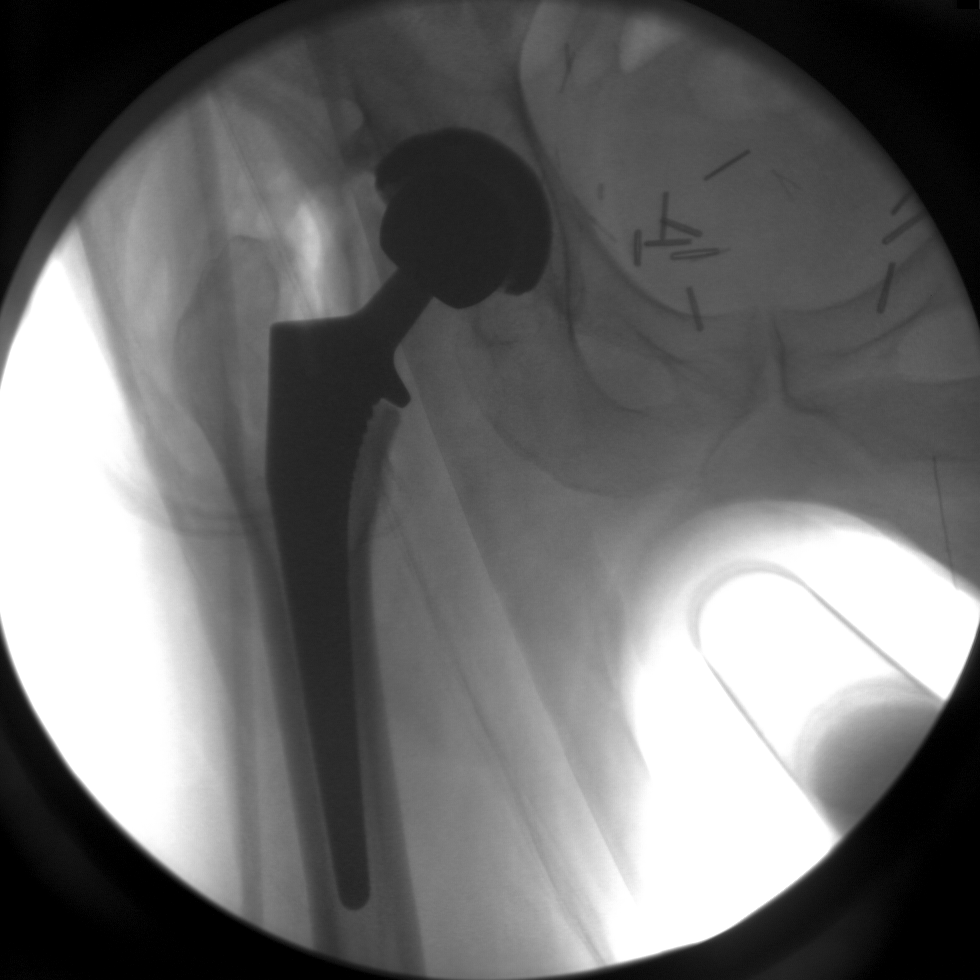
[im 2/2]
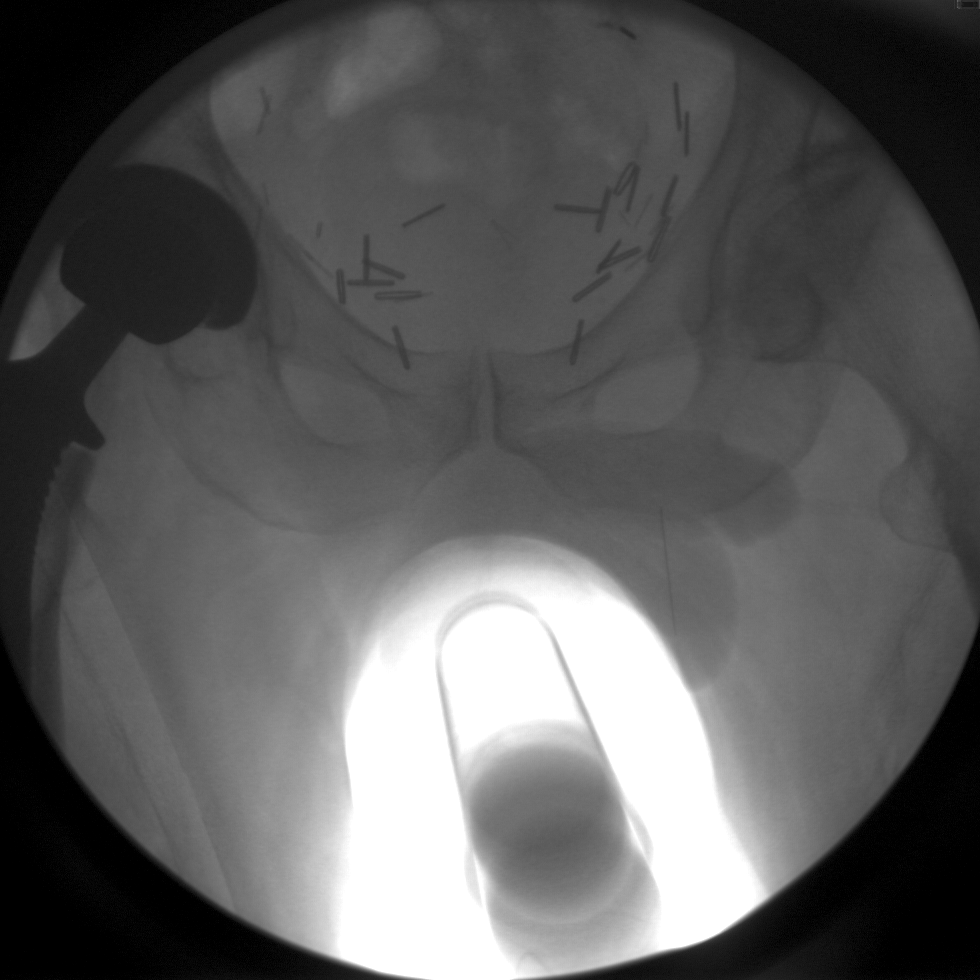

[2 of 2 positions shown; findings below may reference images not displayed]

FINDINGS: Two intraoperative views of the right hip show total
right hip arthroplasty.  Surgical clips consistent with lymph node
dissection project over the pelvis bilaterally.  No acute bony
abnormality identified.
IMPRESSION: Changes of right hip arthroplasty.  No complicating feature
identified.

## 2013-08-17 IMAGING — CR DG HIP 1V PORT*R*
1 series · 1 of 1 positions shown · non-contrast
Comparison: Intraoperative views of the right hip earlier this same
date.

CLINICAL DATA: Hip replacement.

PORTABLE RIGHT HIP - 1 VIEW

[AP]
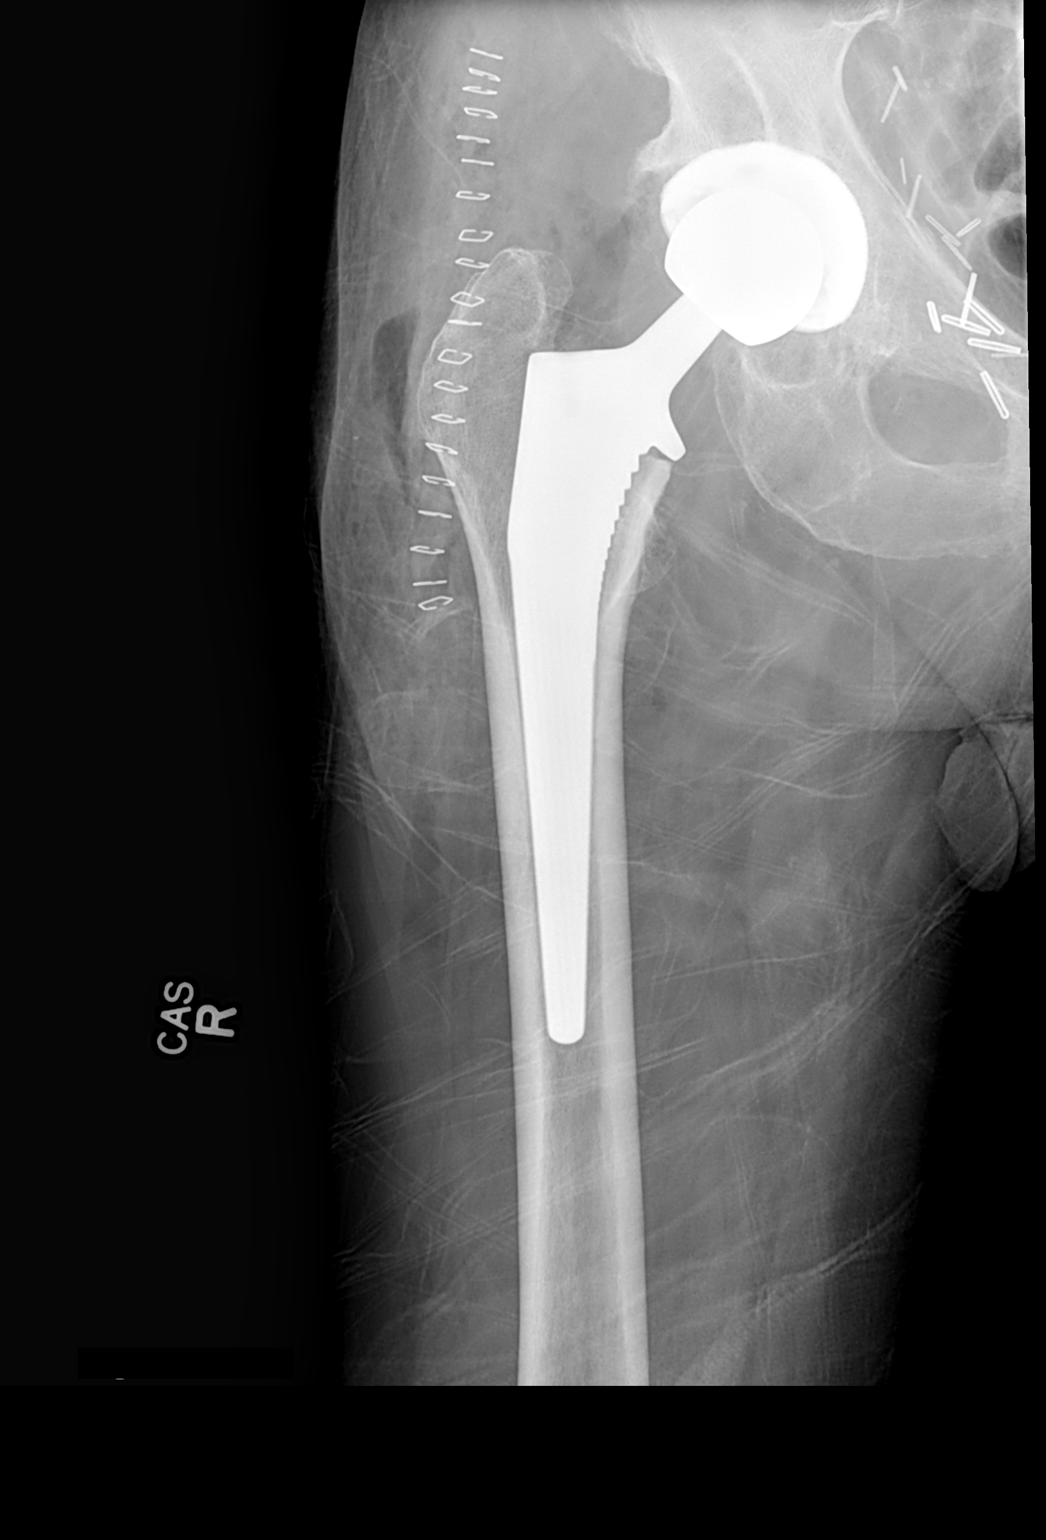

[1 of 1 positions shown; findings below may reference images not displayed]

FINDINGS: Right total hip arthroplasty is in place.  The device is
located and no fracture is seen.  Gas in the soft tissues and
surgical staples noted.
IMPRESSION: Right total hip replacement without evidence of complication.

## 2013-08-18 NOTE — Progress Notes (Signed)
Pt. Informed of labwork

## 2013-09-01 ENCOUNTER — Encounter: Payer: Self-pay | Admitting: Cardiovascular Disease

## 2013-09-01 ENCOUNTER — Ambulatory Visit (INDEPENDENT_AMBULATORY_CARE_PROVIDER_SITE_OTHER): Payer: Medicare Other | Admitting: Cardiovascular Disease

## 2013-09-01 VITALS — BP 141/61 | HR 57 | Ht 70.0 in | Wt 170.0 lb

## 2013-09-01 DIAGNOSIS — I10 Essential (primary) hypertension: Secondary | ICD-10-CM

## 2013-09-01 DIAGNOSIS — I2510 Atherosclerotic heart disease of native coronary artery without angina pectoris: Secondary | ICD-10-CM

## 2013-09-01 DIAGNOSIS — I4891 Unspecified atrial fibrillation: Secondary | ICD-10-CM

## 2013-09-01 NOTE — Assessment & Plan Note (Signed)
Rate controlled on Xarelto  oral anticoagulation 

## 2013-09-01 NOTE — Patient Instructions (Signed)
Your physician wants you to follow-up in: 6 months with Dr Berry. You will receive a reminder letter in the mail two months in advance. If you don't receive a letter, please call our office to schedule the follow-up appointment.  

## 2013-09-01 NOTE — Progress Notes (Signed)
09/01/2013 Blake Stark   08/19/1927  [id number]  Primary Physician Pamelia Hoit, MD Primary Cardiologist: Runell Gess MD Roseanne Reno   HPI:  79 year old, mildly overweight, married Caucasian male, father of 3, grandfather to 6 grandchildren 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 He has a history of severe LV dysfunction with moderate to severe AI and MR. Though EF improved to 40-50% on Echo in June 2014. He also has a large thoracic aortic aneurysm. He was catheterized by Dr. Garnette Scheuermann in 2000 and found to have minimal CAD and normal right heart pressures. He denied chest pain or shortness of breath. Dr. Allyson Sabal did discuss thoracic aortic aneurysm repair, which he does not want to address because of his age and comorbidities. Dr. Allyson Sabal also discussed ICD therapy for primary prevention of sudden death, which does not interest him as well. He had a right total hip replacement a year ago by Dr. Rayburn Ma which he tolerated well and has afforded him better quality of life. His lab work is followed by Dr. Andrey Campanile. He was admitted for possible congestive heart failure this past weekend and discharge the following day. His BNP was elevated. He did have chest pain leading up to this was a point-of-care troponins mildly positive. He's not had a stress test in 4 years. His last echo performed 4 months ago showed an EF of 40-50% with moderate AS/AI and MR. His EKG with Atrial fib with slow VR. Dr. Allyson Sabal felt cardiac cath was needed after nuc study was postive for ischemia. His cath revealed a 95% proximal eccentric calcified LAD. He then underwent successful HSRA with Dr. Tresa Endo with cutting balloon and BMS..  Due to chronic atrial fib pt was placed on Xarelto and ASA was stopped. Pt is also on Plavix. Also with significant bradycardia into the 30s on tele his amiodarone was stopped as he was in permanent atrial fib. Since discharge from the hospital he has remained  asymptomatic and specifically denies chest pain or shortness of breath.    Current Outpatient Prescriptions  Medication Sig Dispense Refill  . ALPRAZolam (XANAX) 0.5 MG tablet Take 0.5 mg by mouth at bedtime.       . clopidogrel (PLAVIX) 75 MG tablet Take 1 tablet (75 mg total) by mouth daily with breakfast.  30 tablet  5  . enalapril (VASOTEC) 5 MG tablet Take 1 tablet (5 mg total) by mouth daily.  30 tablet  5  . fish oil-omega-3 fatty acids 1000 MG capsule Take 1 g by mouth daily with breakfast.      . furosemide (LASIX) 40 MG tablet Take 1 tablet (40 mg total) by mouth daily.  30 tablet  5  . iron polysaccharides (NIFEREX) 150 MG capsule Take 1 capsule (150 mg total) by mouth daily.  30 capsule  5  . Multiple Vitamin (MULITIVITAMIN WITH MINERALS) TABS Take 1 tablet by mouth daily with breakfast.      . naproxen sodium (ANAPROX) 550 MG tablet Take 550 mg by mouth 2 (two) times daily as needed (for pain).       . pantoprazole (PROTONIX) 40 MG tablet Take 1 tablet by mouth daily.      . Rivaroxaban (XARELTO) 15 MG TABS tablet Take 1 tablet (15 mg total) by mouth daily.  30 tablet  11   No current facility-administered medications for this visit.    Allergies  Allergen Reactions  . Lipitor [Atorvastatin]   . Sanctura [Trospium]  Reaction=heart failure per patient??  . Zetia [Ezetimibe]     Myalgias per pt.  . Zocor [Simvastatin]     History   Social History  . Marital Status: Married    Spouse Name: N/A    Number of Children: N/A  . Years of Education: N/A   Occupational History  . Not on file.   Social History Main Topics  . Smoking status: Never Smoker   . Smokeless tobacco: Never Used  . Alcohol Use: No  . Drug Use: No  . Sexual Activity: No   Other Topics Concern  . Not on file   Social History Narrative  . No narrative on file     Review of Systems: General: negative for chills, fever, night sweats or weight changes.  Cardiovascular: negative for chest  pain, dyspnea on exertion, edema, orthopnea, palpitations, paroxysmal nocturnal dyspnea or shortness of breath Dermatological: negative for rash Respiratory: negative for cough or wheezing Urologic: negative for hematuria Abdominal: negative for nausea, vomiting, diarrhea, bright red blood per rectum, melena, or hematemesis Neurologic: negative for visual changes, syncope, or dizziness All other systems reviewed and are otherwise negative except as noted above.    Blood pressure 141/61, pulse 57, height 5\' 10"  (1.778 m), weight 170 lb (77.111 kg).  General appearance: alert and no distress Neck: no adenopathy, no carotid bruit, no JVD, supple, symmetrical, trachea midline and thyroid not enlarged, symmetric, no tenderness/mass/nodules Lungs: clear to auscultation bilaterally Heart: irregularly irregular rhythm and stenosis/aortic insufficiency murmur Extremities: extremities normal, atraumatic, no cyanosis or edema and right femoral arterial puncture site was well-healed  EKG not performed today  ASSESSMENT AND PLAN:   Atrial fibrillation  Rate controlled on Xarelto oral anticoagulation.  CAD (coronary artery disease), cath 07/30/13 LAD 95% stenosis/clacification, for high speed rotational atherectomy/PTCA/Stent The patient had a cardiac catheterization performed by myself because of chest pain and a positive Myoview that revealed a high-grade calcified proximal LAD lesion. He subsequently underwent a high-speed rotational atherectomy by Dr. Daphene Jaeger with cutting balloon angioplasty and implantation of a bare-metal stent with excellent intracatheter clinical results. He will chest pain or shortness of breath. Aspirin was discontinued patient is on Xarelto oral anti-coagulation  because of chronic A. Fib as was Plavix  HTN (hypertension) Well-controlled on current medications      Runell Gess MD [facility], [facility] 09/01/2013 12:19 PM

## 2013-09-01 NOTE — Assessment & Plan Note (Signed)
Well-controlled on current medications 

## 2013-09-01 NOTE — Assessment & Plan Note (Signed)
The patient had a cardiac catheterization performed by myself because of chest pain and a positive Myoview that revealed a high-grade calcified proximal LAD lesion. He subsequently underwent a high-speed rotational atherectomy by Dr. Daphene Jaeger with cutting balloon angioplasty and implantation of a bare-metal stent with excellent intracatheter clinical results. He will chest pain or shortness of breath. Aspirin was discontinued patient is on Xarelto oral anti-coagulation  because of chronic A. Fib as was Plavix

## 2013-10-09 ENCOUNTER — Telehealth: Payer: Self-pay | Admitting: *Deleted

## 2013-10-09 NOTE — Telephone Encounter (Signed)
Faxed to [employer] prior authorization form for Xarelto 15mg  once daily

## 2013-11-07 ENCOUNTER — Telehealth: Payer: Self-pay | Admitting: Cardiology

## 2013-11-08 NOTE — Telephone Encounter (Signed)
At 21:50 on 11-24-13, Received call from [place] paramedics name Hurell.  Hurell reported that the patient passed away at 01-25-2117 after what appears to be a cardia arrest.   The paramedic team reported that they received call and started CPR at 01/25/42 for an initial VF arrest and then persistent asystole. He received multiple shocks and prolonged CPR before he passed away at 01-25-17.  The paramedic team is to report to patient's PCP who is on the other line. I suggest them to discuss with patient's PCP for potential need of Medical examiner evaluation.   I will forward this message to patient's cardiologist Dr. Gwenlyn Found.   Manus Gunning, MD

## 2013-11-08 DEATH — deceased

## 2013-11-13 ENCOUNTER — Telehealth: Payer: Self-pay

## 2013-11-13 NOTE — Telephone Encounter (Signed)
Patient past away @ Home per Obituary in [place] [employer] °

## 2014-03-02 ENCOUNTER — Ambulatory Visit: Payer: Medicare Other | Admitting: Cardiovascular Disease

## 2014-09-16 ENCOUNTER — Encounter (HOSPITAL_COMMUNITY): Payer: Self-pay | Admitting: Cardiovascular Disease

## 2014-12-21 IMAGING — CR DG CHEST 2V
2 series · 2 of 2 positions shown · non-contrast
Comparison: 05/25/2013 chest x-ray at [REDACTED]

CLINICAL DATA: Chest pain.

EXAM:
CHEST - 2 VIEW

[w chest pa]
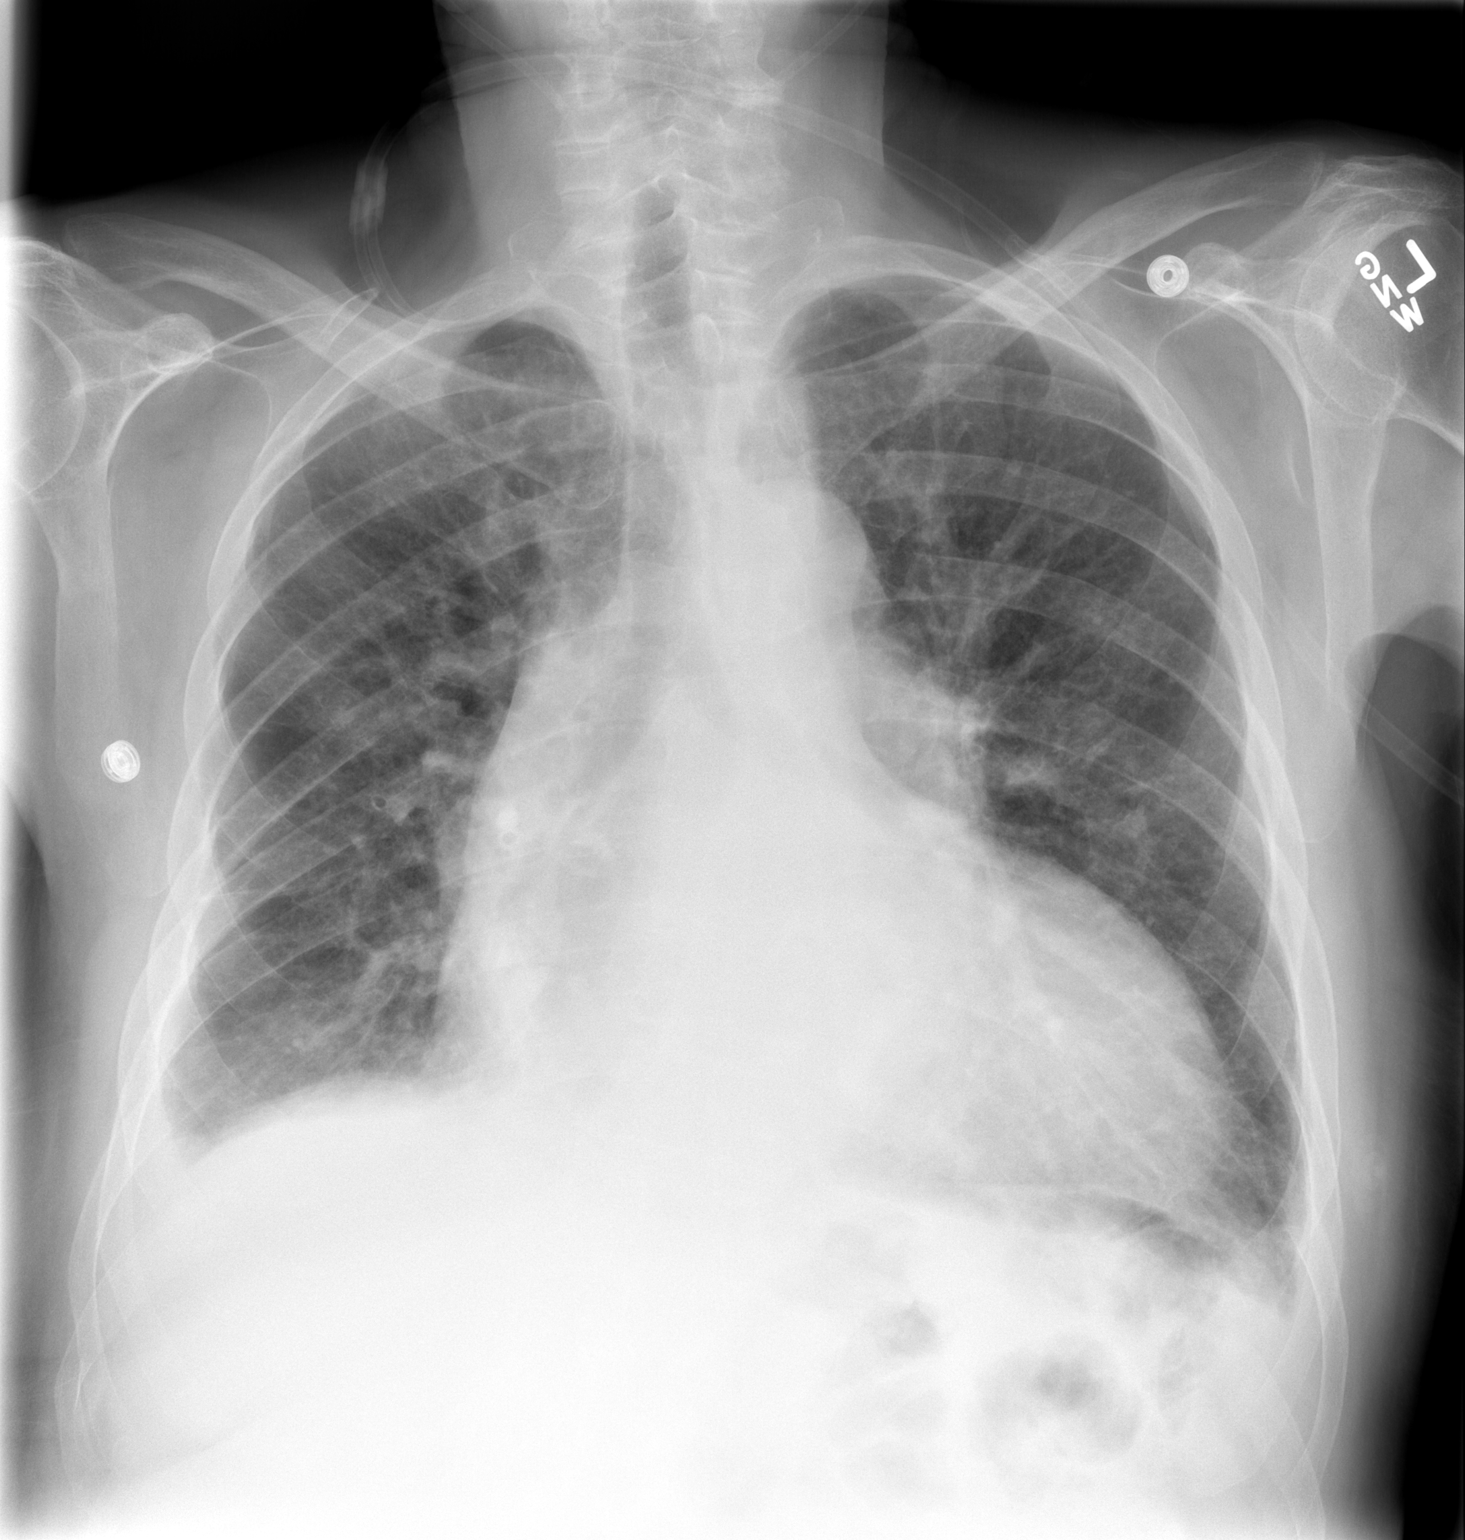

[w chest lat]
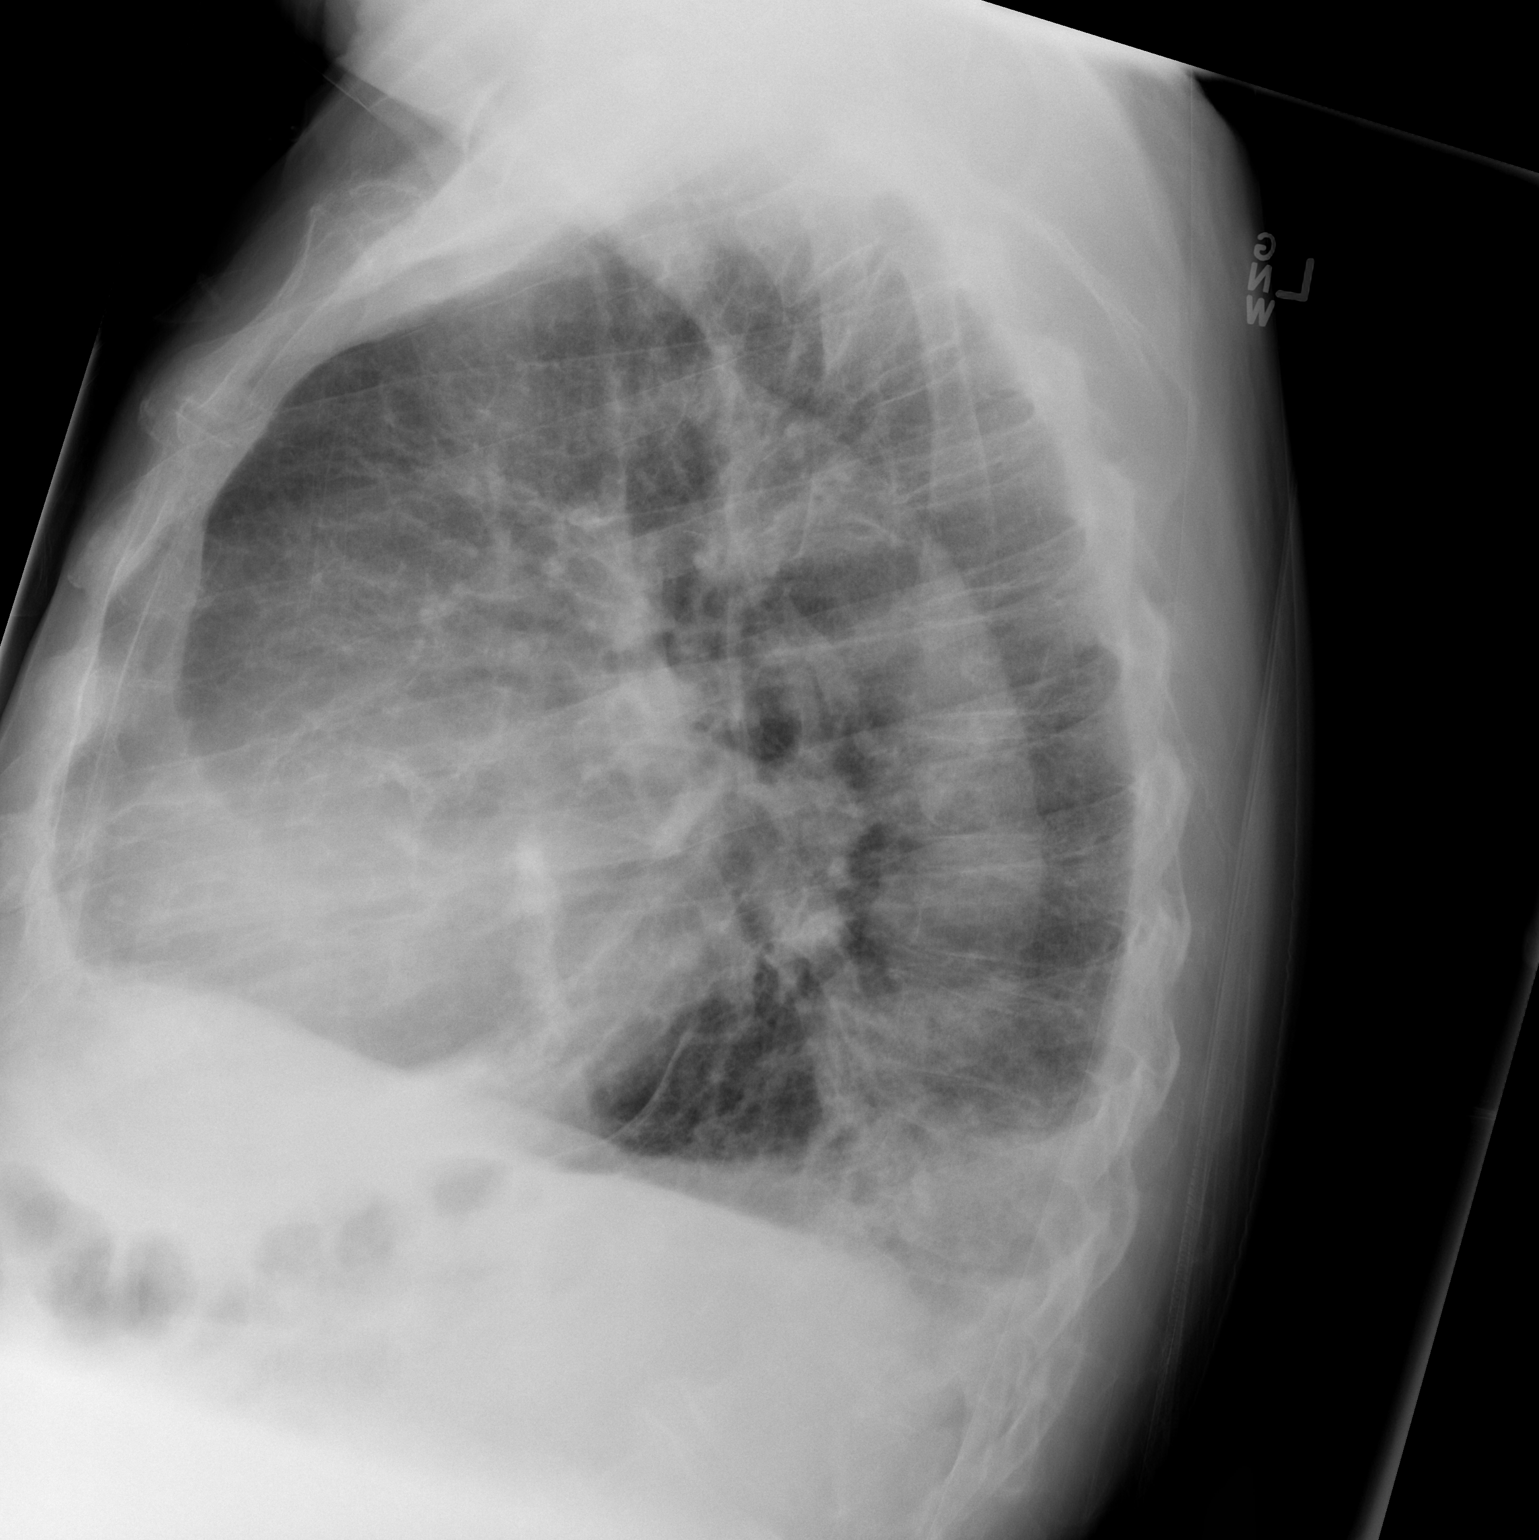

[2 of 2 positions shown; findings below may reference images not displayed]

FINDINGS: Stable significant cardiac enlargement. There is increasing
pulmonary vascularity suggestive of interstitial edema compared to
the prior chest x-ray. There are small bilateral pleural effusions.
No focal airspace consolidation is identified. No nodules are
detected. The bony thorax is unremarkable.
IMPRESSION: Interstitial edema with small bilateral pleural effusions.
